# Patient Record
Sex: Male | Born: 1985
Health system: Southern US, Community
[De-identification: ages and names within clinical notes are randomized; demographics above are authoritative.]

## PROBLEM LIST (undated history)

## (undated) DIAGNOSIS — E669 Obesity, unspecified: Secondary | ICD-10-CM

## (undated) HISTORY — DX: Obesity, unspecified: E66.9

## (undated) HISTORY — PX: CIRCUMCISION: SUR203

---
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
## 2008-12-12 ENCOUNTER — Emergency Department: Payer: Self-pay | Admitting: Internal Medicine

## 2009-05-13 HISTORY — PX: HERNIA REPAIR: SHX51

## 2013-12-28 LAB — LIPID PANEL
Cholesterol: 156 mg/dL (ref 0–200)
HDL: 49 mg/dL (ref 35–70)
LDL Cholesterol: 99 mg/dL
TRIGLYCERIDES: 42 mg/dL (ref 40–160)

## 2014-01-18 ENCOUNTER — Ambulatory Visit: Payer: Self-pay | Admitting: General Practice

## 2015-01-31 IMAGING — CR DG CHEST 1V
1 series · 1 of 1 positions shown · non-contrast
Comparison: None.

CLINICAL DATA: Screening for tuberculosis

EXAM:
CHEST - 1 VIEW

[pa]
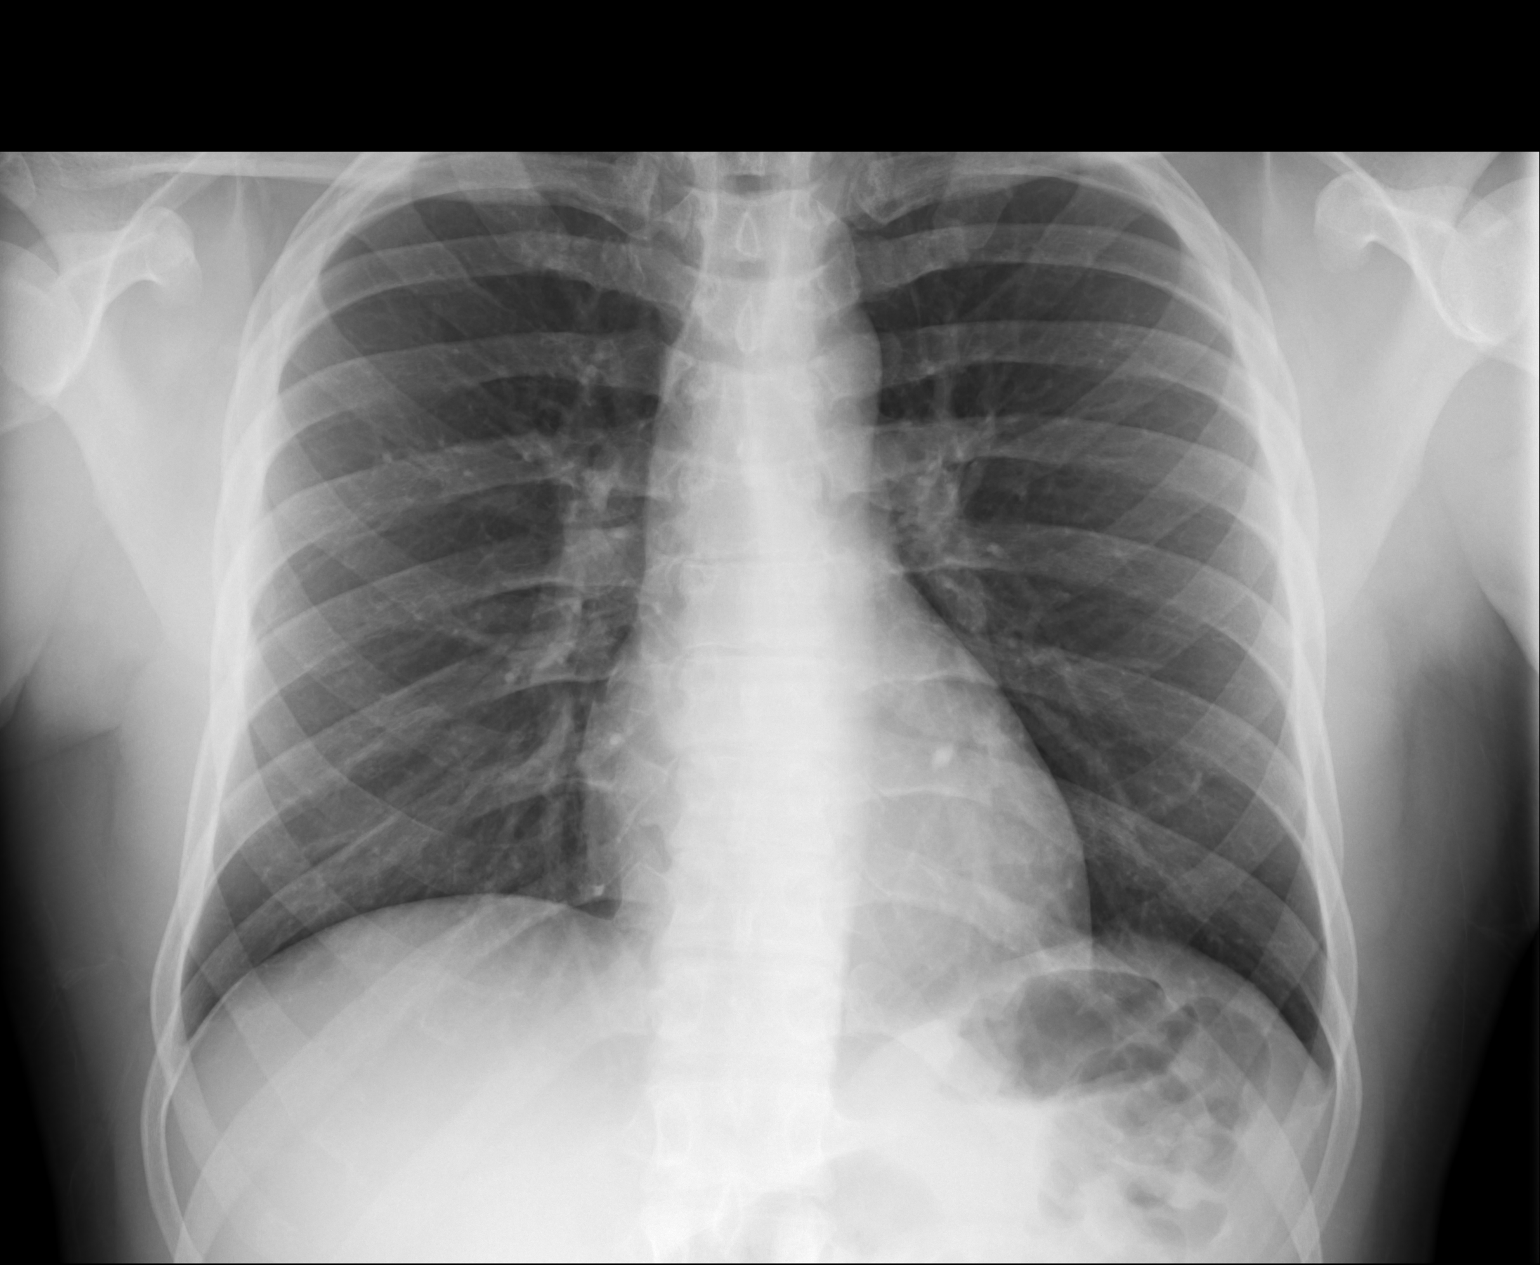

[1 of 1 positions shown; findings below may reference images not displayed]

FINDINGS: No active infiltrate or effusion is seen. No sequela of prior
tuberculous infection is seen. Mediastinal contours appear normal.
The heart is within normal limits in size. No bony abnormality is
noted.
IMPRESSION: No active lung disease.

## 2016-01-01 ENCOUNTER — Ambulatory Visit (INDEPENDENT_AMBULATORY_CARE_PROVIDER_SITE_OTHER): Payer: 59 | Admitting: Family Medicine

## 2016-01-01 ENCOUNTER — Encounter: Payer: Self-pay | Admitting: Family Medicine

## 2016-01-01 VITALS — BP 116/80 | HR 78 | Temp 98.4°F | Resp 16 | Ht 73.0 in | Wt 222.9 lb

## 2016-01-01 DIAGNOSIS — R5383 Other fatigue: Secondary | ICD-10-CM

## 2016-01-01 DIAGNOSIS — Z0000 Encounter for general adult medical examination without abnormal findings: Secondary | ICD-10-CM | POA: Diagnosis not present

## 2016-01-01 DIAGNOSIS — Z131 Encounter for screening for diabetes mellitus: Secondary | ICD-10-CM | POA: Diagnosis not present

## 2016-01-01 DIAGNOSIS — L910 Hypertrophic scar: Secondary | ICD-10-CM | POA: Diagnosis not present

## 2016-01-01 DIAGNOSIS — N528 Other male erectile dysfunction: Secondary | ICD-10-CM | POA: Diagnosis not present

## 2016-01-01 DIAGNOSIS — Z113 Encounter for screening for infections with a predominantly sexual mode of transmission: Secondary | ICD-10-CM | POA: Diagnosis not present

## 2016-01-01 DIAGNOSIS — E663 Overweight: Secondary | ICD-10-CM

## 2016-01-01 DIAGNOSIS — R0981 Nasal congestion: Secondary | ICD-10-CM | POA: Diagnosis not present

## 2016-01-01 DIAGNOSIS — Z13220 Encounter for screening for lipoid disorders: Secondary | ICD-10-CM | POA: Diagnosis not present

## 2016-01-01 DIAGNOSIS — B360 Pityriasis versicolor: Secondary | ICD-10-CM | POA: Insufficient documentation

## 2016-01-01 DIAGNOSIS — G4726 Circadian rhythm sleep disorder, shift work type: Secondary | ICD-10-CM | POA: Insufficient documentation

## 2016-01-01 DIAGNOSIS — E669 Obesity, unspecified: Secondary | ICD-10-CM | POA: Insufficient documentation

## 2016-01-01 DIAGNOSIS — M25519 Pain in unspecified shoulder: Secondary | ICD-10-CM | POA: Insufficient documentation

## 2016-01-01 MED ORDER — SILDENAFIL CITRATE 100 MG PO TABS
50.0000 mg | ORAL_TABLET | Freq: Every day | ORAL | Status: DC | PRN
Start: 2016-01-01 — End: 2017-10-04

## 2016-01-01 MED ORDER — MOMETASONE FUROATE 50 MCG/ACT NA SUSP: 2.0000 | Freq: Every day | NASAL | Status: DC

## 2016-01-01 NOTE — Progress Notes (Signed)
Name: Bryan Horton   MRN: [id number]    DOB: December 18, 1985   Date:01/01/2016       Progress Note  Subjective  Chief Complaint  Chief Complaint  Patient presents with  . Annual Exam  . Labs Only    HPI  Male Exam: he lost over 20 lbs over the past 2 years, by changing his diet and exercising, his bp is back to normal now. He feels tired, but thinks it is because he works too much.  ED: he has been in the same relationship for the past 9 years, they have two children together, he states he has libido, and more than half of the time that they have intercourse he has difficulty maintaining an erection for over 5 minutes.   AR: he has noticed nasal congestion and sinus pressure for the past few days, no fever, mild cough, no rashes.    Patient Active Problem List   Diagnosis Date Noted  . 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 Tinea versicolor 01/01/2016  . Pain in shoulder 01/01/2016  . Overweight (BMI 25.0-29.9) 01/01/2016    Past Surgical History  Procedure Laterality Date  . Circumcision      as an infant  . Hernia repair  05/13/2009    Umbilical    History reviewed. No pertinent family history.  Social History   Social History  . Marital Status: Single    Spouse Name: N/A  . Number of Children: N/A  . Years of Education: N/A   Occupational History  . Not on file.   Social History Main Topics  . Smoking status: Former Games developer  . Smokeless tobacco: Not on file  . Alcohol Use: 0.0 oz/week    0 Standard drinks or equivalent per week     Comment: seldom  . Drug Use: No  . Sexual Activity:    Partners: Female    Copy: None   Other Topics Concern  . Not on file   Social History Narrative     Current outpatient prescriptions:  .  mometasone (NASONEX) 50 MCG/ACT nasal spray, Place 2 sprays into the nose daily., Disp: 17 g, Rfl: 12 .  sildenafil (VIAGRA) 100 MG tablet, Take 0.5-1 tablets (50-100 mg total) by mouth daily as needed for  erectile dysfunction., Disp: 10 tablet, Rfl: 2  No Known Allergies   ROS  Constitutional: Negative for fever, positive for weight change.  Respiratory: Negative for cough and shortness of breath.   Cardiovascular: Negative for chest pain or palpitations.  Gastrointestinal: Negative for abdominal pain, no bowel changes.  Musculoskeletal: Negative for gait problem or joint swelling.  Skin: Negative for rash.  Neurological: Negative for dizziness or headache.  No other specific complaints in a complete review of systems (except as listed in HPI above).  Objective  Filed Vitals:   01/01/16 1031  Pulse: 78  Temp: 98.4 F (36.9 C)  TempSrc: Oral  Resp: 16  Height:  (1.854 m)  Weight: 222 lb 14.4 oz (101.107 kg)  SpO2: 97%    Body mass index is 29.41 kg/(m^2).  Physical Exam  Constitutional: Patient appears well-developed and well-nourished, overweight.  No distress.  HENT: Head: Normocephalic and atraumatic. Ears: B TMs ok, no erythema or effusion; Nose: Nose normal. Mouth/Throat: Oropharynx is clear and moist. No oropharyngeal exudate.  Eyes: Conjunctivae and EOM are normal. Pupils are equal, round, and reactive to light. No scleral icterus.  Neck: Normal range of motion. Neck supple. No JVD present. No  thyromegaly present.  Cardiovascular: Normal rate, regular rhythm and normal heart sounds.  No murmur heard. No BLE edema. Pulmonary/Chest: Effort normal and breath sounds normal. No respiratory distress. Abdominal: Soft. Bowel sounds are normal, no distension. There is no tenderness. no masses MALE GENITALIA: Normal descended testes bilaterally, no masses palpated, no hernias, no lesions, no discharge RECTAL: no done Musculoskeletal: Normal range of motion, no joint effusions. No gross deformities Neurological: he is alert and oriented to person, place, and time. No cranial nerve deficit. Coordination, balance, strength, speech and gait are normal.  Skin: Skin is warm,  dry skin and keloids. No erythema.  Psychiatric: Patient has a normal mood and affect. behavior is normal. Judgment and thought content normal.  PHQ2/9: Depression screen PHQ 2/9 01/01/2016  Decreased Interest 0  Down, Depressed, Hopeless 0  PHQ - 2 Score 0    Fall Risk: Fall Risk  01/01/2016  Falls in the past year? No    Functional Status Survey: Is the patient deaf or have difficulty hearing?: No Does the patient have difficulty seeing, even when wearing glasses/contacts?: No (patient wears glasses from time to time) Does the patient have difficulty concentrating, remembering, or making decisions?: No (patient stated that he has some issues with memory. he admitted to working a lot.) Does the patient have difficulty walking or climbing stairs?: No Does the patient have difficulty dressing or bathing?: No Does the patient have difficulty doing errands alone such as visiting a doctor's office or shopping?: No    Assessment & Plan  1. Annual physical exam  Discussed importance of 150 minutes of physical activity weekly, eat two servings of fish weekly, eat one serving of tree nuts ( cashews, pistachios, pecans, almonds.Marland Kitchen) every other day, eat 6 servings of fruit/vegetables daily and drink plenty of water and avoid sweet beverages.   2. Overweight (BMI 25.0-29.9)  Discussed with the patient the risk posed by an increased BMI. Discussed importance of portion control, calorie counting and at least 150 minutes of physical activity weekly. Avoid sweet beverages and drink more water. Eat at least 6 servings of fruit and vegetables daily   3. Routine screening for STI (sexually transmitted infection)  - Chlamydia/Gonococcus/Trichomonas, NAA - RPR - HIV antibody  4. Other fatigue  - TSH - Vitamin B12 - VITAMIN D 25 Hydroxy (Vit-D Deficiency, Fractures) - CBC with Differential/Platelet - Comprehensive metabolic panel  5. Diabetes mellitus screening  - Hemoglobin A1c  6. Lipid  screening  - Lipid panel   7. Other male erectile dysfunction  Likely from worrying about not having an erection, advised to try Viagra, discussed possible side effects, and wean self off after taking it a few times.  - sildenafil (VIAGRA) 100 MG tablet; Take 0.5-1 tablets (50-100 mg total) by mouth daily as needed for erectile dysfunction.  Dispense: 10 tablet; Refill: 2   8. Keloid scar  Stable, from old acne  9. Nasal congestion  We will try nasal steroid, call back if no improvement - mometasone (NASONEX) 50 MCG/ACT nasal spray; Place 2 sprays into the nose daily.  Dispense: 17 g; Refill: 12

## 2016-01-02 LAB — HEMOGLOBIN A1C
ESTIMATED AVERAGE GLUCOSE: 120 mg/dL
Hgb A1c MFr Bld: 5.8 % — ABNORMAL HIGH (ref 4.8–5.6)

## 2016-01-02 LAB — COMPREHENSIVE METABOLIC PANEL
ALBUMIN: 4.6 g/dL (ref 3.5–5.5)
ALK PHOS: 61 IU/L (ref 39–117)
ALT: 18 IU/L (ref 0–44)
AST: 20 IU/L (ref 0–40)
Albumin/Globulin Ratio: 1.6 (ref 1.1–2.5)
BUN / CREAT RATIO: 11 (ref 8–19)
BUN: 10 mg/dL (ref 6–20)
Bilirubin Total: 0.6 mg/dL (ref 0.0–1.2)
CALCIUM: 9.8 mg/dL (ref 8.7–10.2)
CO2: 28 mmol/L (ref 18–29)
CREATININE: 0.94 mg/dL (ref 0.76–1.27)
Chloride: 97 mmol/L (ref 96–106)
GFR calc Af Amer: 126 mL/min/{1.73_m2} (ref >59)
GFR, EST NON AFRICAN AMERICAN: 109 mL/min/{1.73_m2} (ref >59)
GLUCOSE: 88 mg/dL (ref 65–99)
Globulin, Total: 2.9 g/dL (ref 1.5–4.5)
Potassium: 4.4 mmol/L (ref 3.5–5.2)
Sodium: 139 mmol/L (ref 134–144)
Total Protein: 7.5 g/dL (ref 6.0–8.5)

## 2016-01-02 LAB — LIPID PANEL
CHOLESTEROL TOTAL: 155 mg/dL (ref 100–199)
Chol/HDL Ratio: 2.6 ratio units (ref 0.0–5.0)
HDL: 60 mg/dL (ref >39)
LDL CALC: 87 mg/dL (ref 0–99)
TRIGLYCERIDES: 39 mg/dL (ref 0–149)
VLDL CHOLESTEROL CAL: 8 mg/dL (ref 5–40)

## 2016-01-02 LAB — CBC WITH DIFFERENTIAL/PLATELET
BASOS ABS: 0 10*3/uL (ref 0.0–0.2)
Basos: 1 %
EOS (ABSOLUTE): 0.1 10*3/uL (ref 0.0–0.4)
EOS: 1 %
HEMATOCRIT: 48.9 % (ref 37.5–51.0)
HEMOGLOBIN: 15.9 g/dL (ref 12.6–17.7)
IMMATURE GRANULOCYTES: 0 %
Immature Grans (Abs): 0 10*3/uL (ref 0.0–0.1)
LYMPHS ABS: 1.9 10*3/uL (ref 0.7–3.1)
Lymphs: 29 %
MCH: 28.2 pg (ref 26.6–33.0)
MCHC: 32.5 g/dL (ref 31.5–35.7)
MCV: 87 fL (ref 79–97)
MONOCYTES: 6 %
Monocytes Absolute: 0.4 10*3/uL (ref 0.1–0.9)
NEUTROS PCT: 63 %
Neutrophils Absolute: 4.1 10*3/uL (ref 1.4–7.0)
Platelets: 266 10*3/uL (ref 150–379)
RBC: 5.64 x10E6/uL (ref 4.14–5.80)
RDW: 13.9 % (ref 12.3–15.4)
WBC: 6.5 10*3/uL (ref 3.4–10.8)

## 2016-01-02 LAB — HIV ANTIBODY (ROUTINE TESTING W REFLEX): HIV SCREEN 4TH GENERATION: NONREACTIVE

## 2016-01-02 LAB — VITAMIN B12: Vitamin B-12: 462 pg/mL (ref 211–946)

## 2016-01-02 LAB — RPR: RPR: NONREACTIVE

## 2016-01-02 LAB — VITAMIN D 25 HYDROXY (VIT D DEFICIENCY, FRACTURES): VIT D 25 HYDROXY: 14.4 ng/mL — AB (ref 30.0–100.0)

## 2016-01-02 LAB — TSH: TSH: 0.463 u[IU]/mL (ref 0.450–4.500)

## 2016-01-04 ENCOUNTER — Other Ambulatory Visit: Payer: Self-pay | Admitting: Family Medicine

## 2016-01-04 MED ORDER — VITAMIN D (ERGOCALCIFEROL) 1.25 MG (50000 UNIT) PO CAPS: 50000.0000 [IU] | ORAL_CAPSULE | ORAL | Status: DC

## 2016-07-22 ENCOUNTER — Other Ambulatory Visit: Payer: Self-pay | Admitting: Physical Medicine and Rehabilitation

## 2016-07-22 ENCOUNTER — Ambulatory Visit
Admission: RE | Admit: 2016-07-22 | Discharge: 2016-07-22 | Disposition: A | Discharge status: Home / Self Care | Payer: No Typology Code available for payment source | Source: Ambulatory Visit | Attending: Physical Medicine and Rehabilitation | Admitting: Physical Medicine and Rehabilitation

## 2016-07-22 DIAGNOSIS — Z0000 Encounter for general adult medical examination without abnormal findings: Secondary | ICD-10-CM

## 2017-02-08 ENCOUNTER — Encounter: Payer: Self-pay | Admitting: Family Medicine

## 2017-02-08 ENCOUNTER — Ambulatory Visit (INDEPENDENT_AMBULATORY_CARE_PROVIDER_SITE_OTHER): Payer: 59 | Admitting: Family Medicine

## 2017-02-08 VITALS — BP 126/88 | Ht 73.0 in | Wt 236.1 lb

## 2017-02-08 DIAGNOSIS — L739 Follicular disorder, unspecified: Secondary | ICD-10-CM

## 2017-02-08 DIAGNOSIS — B350 Tinea barbae and tinea capitis: Secondary | ICD-10-CM

## 2017-02-08 MED ORDER — GRISEOFULVIN MICROSIZE 500 MG PO TABS: 500.0000 mg | ORAL_TABLET | Freq: Every day | ORAL | 0 refills | Status: DC

## 2017-02-08 MED ORDER — DOXYCYCLINE HYCLATE 100 MG PO CAPS: 100.0000 mg | ORAL_CAPSULE | Freq: Two times a day (BID) | ORAL | 0 refills | Status: DC

## 2017-02-08 NOTE — Progress Notes (Signed)
   Subjective:    Patient ID: Bryan Horton, male    DOB: [birth year], 31 y.o.   MRN: [id number]  HPI Patient in today for abscess like spot in beard. Has c/o itching and sore . Patient is a new patient has a area on his beard some scaling crusting a little bit of pus drainage is on had tinea capitis. The patient relates some itching slight burning slight soreness minimal discharge no other particular troubles. States no other concerns this visit.    Review of Systems No fever chills no difficulty swallowing no cough vomiting    Objective:   Physical Exam The area has he appearance of a possible 10 he a versus localized infection no sign of abscess noted. No sign of any growth. Patient also has a keloid on the right side of his cheek/bearded area   Culture was taken from the central chin region where the scaling was    Assessment & Plan:  Doxycycline twice a day for next 10 days take with snack tall glass of water  Possible tinea recommend griseofulvin 500 mg 1 daily for the next 30 days take with a meal. Follow-up if ongoing troubles

## 2017-02-10 ENCOUNTER — Telehealth: Payer: Self-pay | Admitting: Family Medicine

## 2017-02-10 NOTE — Telephone Encounter (Signed)
Which medication is he speaking of doxycycline? Griseofulvin? It is possible that he may need to check a Price that a another pharmacy. Both of those medications were generic. I would recommend checking [facility] or even [facility] there can be quite a bit of variation of cost of medicine between pharmacy

## 2017-02-10 NOTE — Telephone Encounter (Signed)
Pt called stating that the medication that was called in was going to cost him over 100 dollars. Pt is wanting to know if there is a cheaper option. Please advise.    [facility]

## 2017-02-11 LAB — WOUND CULTURE: ORGANISM ID, BACTERIA: NONE SEEN

## 2017-02-11 NOTE — Telephone Encounter (Signed)
Patient stated the Griseofulvin is expensive and the pharmacist advised he see if he can get something cheaper

## 2017-02-13 NOTE — Telephone Encounter (Signed)
So another medication that can help is called generic Lamisil 250 mg 1 daily for 28 days. Before starting this a person has to do a liver function profile. If the liver function looks normal then this medicine can be used. This medication does have a potential side effects including elevated liver enzymes, in rare cases liver failure, rashes in some cases. Griseofulvin which was the other medicine does not require any lab testing. So therefore if he once to try Lamisil-he will need to get a liver function profile first if it is normal then we can prescribe this. I hope the cost is less then generic griseofulvin but only the pharmacist would know that -this information is not available to us

## 2017-02-14 NOTE — Telephone Encounter (Signed)
Spoke with patient and informed him per Dr.Scott Luking- So another medication that can help is called generic Lamisil 250 mg 1 daily for 28 days. Before starting this a person has to do a liver function profile. If the liver function looks normal then this medicine can be used. This medication does have a potential side effects including elevated liver enzymes, in rare cases liver failure, rashes in some cases. Griseofulvin which was the other medicine does not require any lab testing. So therefore if you want  to try Lamisil-you will need to get a liver function profile first if it is normal then we can prescribe this. Patient verbalized understanding and stated that he will hold off at this time.

## 2017-02-14 NOTE — Telephone Encounter (Signed)
Left message return call 02/14/17 

## 2017-03-11 ENCOUNTER — Telehealth: Payer: Self-pay | Admitting: *Deleted

## 2017-03-11 MED ORDER — DOXYCYCLINE HYCLATE 100 MG PO CAPS: 100.0000 mg | ORAL_CAPSULE | Freq: Two times a day (BID) | ORAL | 0 refills | Status: DC

## 2017-03-11 NOTE — Addendum Note (Signed)
Addended by: Margaretha Sheffield on: 03/11/2017 12:08 PM   Modules accepted: Orders

## 2017-03-11 NOTE — Telephone Encounter (Signed)
Renew the antibiotic/refill. If ongoing trouble follow-up with Dr. Brett Canales

## 2017-03-11 NOTE — Telephone Encounter (Signed)
Prescription sent electronically to pharmacy. Patient notified. 

## 2017-03-11 NOTE — Telephone Encounter (Signed)
Patient was seen for folliculilitis and tina barbae. Patient was placed on doxycycline and lamisil. Patient states he feels the rash is coming back. Patient has finished the antibiotic but still has a few days left of the lamisil-feels like he needs more antibiotic.  [facility]

## 2017-08-04 IMAGING — CR DG CHEST 1V
1 series · 1 of 1 positions shown · non-contrast
Comparison: 01/18/2014.

CLINICAL DATA: Routine checkup.  Obesity.

EXAM:
CHEST 1 VIEW

[w chest pa]
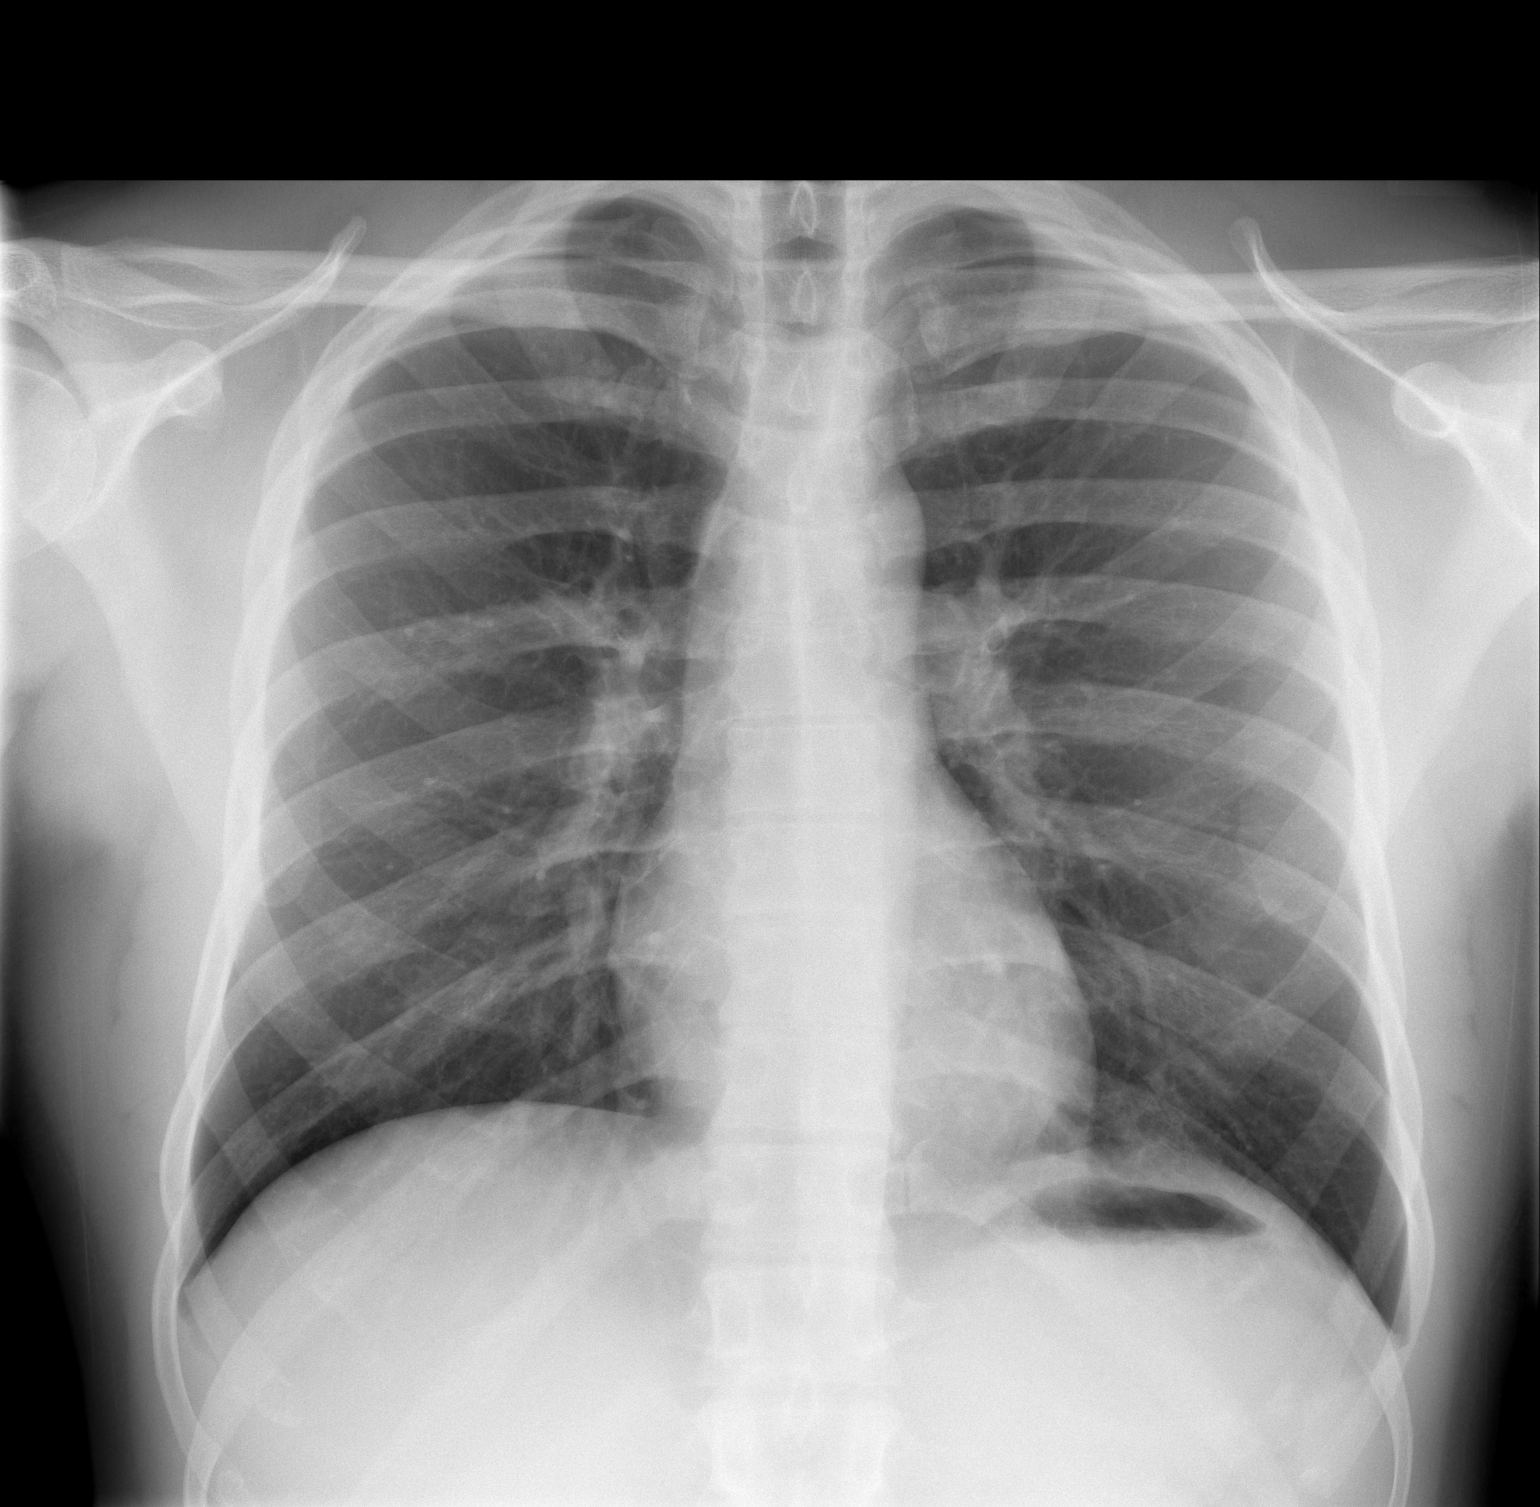

[1 of 1 positions shown; findings below may reference images not displayed]

FINDINGS: The heart size and mediastinal contours are within normal limits.
Both lungs are clear. The visualized skeletal structures are
unremarkable.
IMPRESSION: No active disease.

## 2017-09-05 DIAGNOSIS — W14XXXA Fall from tree, initial encounter: Secondary | ICD-10-CM | POA: Diagnosis not present

## 2017-09-05 DIAGNOSIS — S8992XA Unspecified injury of left lower leg, initial encounter: Secondary | ICD-10-CM | POA: Diagnosis not present

## 2017-09-05 DIAGNOSIS — M79662 Pain in left lower leg: Secondary | ICD-10-CM | POA: Diagnosis not present

## 2017-10-04 ENCOUNTER — Encounter: Payer: Self-pay | Admitting: Family Medicine

## 2017-10-04 ENCOUNTER — Ambulatory Visit (INDEPENDENT_AMBULATORY_CARE_PROVIDER_SITE_OTHER): Payer: 59 | Admitting: Family Medicine

## 2017-10-04 VITALS — BP 130/90 | HR 64 | Temp 97.7°F | Resp 14 | Ht 72.44 in | Wt 229.6 lb

## 2017-10-04 DIAGNOSIS — F520 Hypoactive sexual desire disorder: Secondary | ICD-10-CM

## 2017-10-04 DIAGNOSIS — E669 Obesity, unspecified: Secondary | ICD-10-CM

## 2017-10-04 DIAGNOSIS — Z23 Encounter for immunization: Secondary | ICD-10-CM

## 2017-10-04 DIAGNOSIS — R5383 Other fatigue: Secondary | ICD-10-CM

## 2017-10-04 DIAGNOSIS — R739 Hyperglycemia, unspecified: Secondary | ICD-10-CM

## 2017-10-04 DIAGNOSIS — Z0000 Encounter for general adult medical examination without abnormal findings: Secondary | ICD-10-CM

## 2017-10-04 DIAGNOSIS — Z113 Encounter for screening for infections with a predominantly sexual mode of transmission: Secondary | ICD-10-CM

## 2017-10-04 DIAGNOSIS — Z13220 Encounter for screening for lipoid disorders: Secondary | ICD-10-CM

## 2017-10-04 DIAGNOSIS — R030 Elevated blood-pressure reading, without diagnosis of hypertension: Secondary | ICD-10-CM

## 2017-10-04 DIAGNOSIS — E559 Vitamin D deficiency, unspecified: Secondary | ICD-10-CM

## 2017-10-04 DIAGNOSIS — Z131 Encounter for screening for diabetes mellitus: Secondary | ICD-10-CM

## 2017-10-04 NOTE — Patient Instructions (Signed)
Preventive Care 18-39 Years, Male Preventive care refers to lifestyle choices and visits with your health care provider that can promote health and wellness. What does preventive care include?  A yearly physical exam. This is also called an annual well check.  Dental exams once or twice a year.  Routine eye exams. Ask your health care provider how often you should have your eyes checked.  Personal lifestyle choices, including: ? Daily care of your teeth and gums. ? Regular physical activity. ? Eating a healthy diet. ? Avoiding tobacco and drug use. ? Limiting alcohol use. ? Practicing safe sex. What happens during an annual well check? The services and screenings done by your health care provider during your annual well check will depend on your age, overall health, lifestyle risk factors, and family history of disease. Counseling Your health care provider may ask you questions about your:  Alcohol use.  Tobacco use.  Drug use.  Emotional well-being.  Home and relationship well-being.  Sexual activity.  Eating habits.  Work and work Statistician.  Screening You may have the following tests or measurements:  Height, weight, and BMI.  Blood pressure.  Lipid and cholesterol levels. These may be checked every 5 years starting at age 31.  Diabetes screening. This is done by checking your blood sugar (glucose) after you have not eaten for a while (fasting).  Skin check.  Hepatitis C blood test.  Hepatitis B blood test.  Sexually transmitted disease (STD) testing.  Discuss your test results, treatment options, and if necessary, the need for more tests with your health care provider. Vaccines Your health care provider may recommend certain vaccines, such as:  Influenza vaccine. This is recommended every year.  Tetanus, diphtheria, and acellular pertussis (Tdap, Td) vaccine. You may need a Td booster every 10 years.  Varicella vaccine. You may need this if you  have not been vaccinated.  HPV vaccine. If you are 79 or younger, you may need three doses over 6 months.  Measles, mumps, and rubella (MMR) vaccine. You may need at least one dose of MMR.You may also need a second dose.  Pneumococcal 13-valent conjugate (PCV13) vaccine. You may need this if you have certain conditions and have not been vaccinated.  Pneumococcal polysaccharide (PPSV23) vaccine. You may need one or two doses if you smoke cigarettes or if you have certain conditions.  Meningococcal vaccine. One dose is recommended if you are age 46-21 years and a first-year college student living in a residence hall, or if you have one of several medical conditions. You may also need additional booster doses.  Hepatitis A vaccine. You may need this if you have certain conditions or if you travel or work in places where you may be exposed to hepatitis A.  Hepatitis B vaccine. You may need this if you have certain conditions or if you travel or work in places where you may be exposed to hepatitis B.  Haemophilus influenzae type b (Hib) vaccine. You may need this if you have certain risk factors.  Talk to your health care provider about which screenings and vaccines you need and how often you need them. This information is not intended to replace advice given to you by your health care provider. Make sure you discuss any questions you have with your health care provider. Document Released: 01/11/2002 Document Revised: 08/04/2016 Document Reviewed: 09/16/2015 Elsevier Interactive Patient Education  2017 [employer].

## 2017-10-04 NOTE — Progress Notes (Signed)
Name: Bryan Horton   MRN: [id number]    DOB: 1986-04-09   Date:10/04/2017       Progress Note  Subjective  Chief Complaint  Chief Complaint  Patient presents with  . Annual Exam    HPI  Well Male Exam: he is living with girlfriend for the past 10 years, they have a 31 yo son . He has ED and viagra did not help. He states he is always tired and has no libido even when they travel. Unable to maintain an erection. He has a hard job , works too many hours per day.   Hyperglycemia: denies polyphagia, polydipsia or polyuria. He is obese and discussed importance of losing weight.   Other fatigue: he feels tired, works different shifts on a regular basis. He likes to fish, hunt but not lately.   Ankle injury: he fell out of a deer stand about 6 weeks ago , he went to urgent care, he still has occasional pain, still has swelling, taking naproxen , diagnosed with a strain of left ankle. X-ray not available to review.    Patient Active Problem List   Diagnosis Date Noted  . 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 Tinea versicolor 01/01/2016  . Pain in shoulder 01/01/2016  . Obesity (BMI 30.0-34.9) 01/01/2016    Past Surgical History:  Procedure Laterality Date  . CIRCUMCISION     as an infant  . HERNIA REPAIR  05/13/2009   Umbilical    History reviewed. No pertinent family history.  Social History   Socioeconomic History  . Marital status: Significant Other    Spouse name: Not on file  . Number of children: 1  . Years of education: Not on file  . Highest education level: High school graduate  Social Needs  . Financial resource strain: Not hard at all  . Food insecurity - worry: Never true  . Food insecurity - inability: Never true  . Transportation needs - medical: No  . Transportation needs - non-medical: No  Occupational History  . Occupation: Medical sales representativemaking technician     Employer: [employer]  Tobacco Use  . Smoking status: Former Games developermoker  . Smokeless tobacco: Never  Used  Substance and Sexual Activity  . Alcohol use: Yes    Alcohol/week: 0.0 oz    Comment: seldom  . Drug use: No  . Sexual activity: Yes    Partners: Female    Birth control/protection: None  Other Topics Concern  . Not on file  Social History Narrative   Lives with significant for the past 10 years, they have a 31 yo son   He works 12 hours shifts, it can be day or night.     Current Outpatient Medications:  .  naproxen (NAPROSYN) 500 MG tablet, Take 1 tablet 2 (two) times daily as needed by mouth., Disp: , Rfl:  .  mometasone (NASONEX) 50 MCG/ACT nasal spray, Place 2 sprays into the nose daily. (Patient not taking: Reported on 02/08/2017), Disp: 17 g, Rfl: 12  No Known Allergies   ROS  Constitutional: Negative for fever or significant  weight change.  Respiratory: Negative for cough and shortness of breath.   Cardiovascular: Negative for chest pain or palpitations.  Gastrointestinal: Negative for abdominal pain, no bowel changes.  Musculoskeletal: Positive for mild  gait problem and left  joint swelling.  Skin: Negative for rash.  Neurological: Negative for dizziness or headache.  No other specific complaints in a complete review of systems (except as listed  in HPI above).  Objective  Vitals:   10/04/17 1349  BP: 130/90  Pulse: 64  Resp: 14  Temp: 97.7 F (36.5 C)  TempSrc: Oral  SpO2: 99%  Weight: 229 lb 9.6 oz (104.1 kg)  Height: 6' 0.44" (1.84 m)    Body mass index is 30.76 kg/m.  Physical Exam  Constitutional: Patient appears well-developed and well-nourished. No distress.  HENT: Head: Normocephalic and atraumatic. Ears: B TMs ok, no erythema or effusion; Nose: Nose normal. Mouth/Throat: Oropharynx is clear and moist. No oropharyngeal exudate.  Eyes: Conjunctivae and EOM are normal. Pupils are equal, round, and reactive to light. No scleral icterus.  Neck: Normal range of motion. Neck supple. No JVD present. No thyromegaly present.  Cardiovascular:  Normal rate, regular rhythm and normal heart sounds.  No murmur heard. No BLE edema. Pulmonary/Chest: Effort normal and breath sounds normal. No respiratory distress. Abdominal: Soft. Bowel sounds are normal, no distension. There is no tenderness. no masses MALE GENITALIA: Normal descended testes bilaterally, no masses palpated, no hernias, no lesions, no discharge RECTAL: Prostate normal size and consistency, no rectal masses or hemorrhoids Musculoskeletal: Normal range of motion, no joint effusions. No gross deformities. He has pain during palpation of left lateral malleolus Neurological: he is alert and oriented to person, place, and time. No cranial nerve deficit. Coordination, balance, strength, speech and gait are normal.  Skin: Skin is warm and dry. No rash noted. No erythema.  Psychiatric: Patient has a normal mood and affect. behavior is normal. Judgment and thought content normal.  PHQ2/9: Depression screen [facility] 2/9 10/04/2017 01/01/2016  Decreased Interest 0 0  Down, Depressed, Hopeless 0 0  PHQ - 2 Score 0 0     Fall Risk: Fall Risk  10/04/2017 01/01/2016  Falls in the past year? Yes No  Number falls in past yr: 1 -  Injury with Fall? Yes -  Follow up Education provided -     Assessment & Plan  1. Annual physical exam  Discussed importance of 150 minutes of physical activity weekly, eat two servings of fish weekly, eat one serving of tree nuts ( cashews, pistachios, pecans, almonds.Marland Kitchen.) every other day, eat 6 servings of fruit/vegetables daily and drink plenty of water and avoid sweet beverages.   He seems depressed and having problems with girlfriend. Discussed counseling. PHQ9 negative, but seems to be down, denies suicidal thoughts or ideation    - VITAMIN D 25 Hydroxy (Vit-D Deficiency, Fractures) - TSH - Vitamin B12 - CBC with Differential/Platelet - COMPLETE METABOLIC PANEL WITH GFR - Lipid panel - Hemoglobin A1c - HIV antibody - RPR - GC/Chlamydia Probe  Amp  2. Diabetes mellitus screening  -hgbA1C  3. Lipid screening  - Lipid panel  4. Obesity (BMI 30.0-34.9)  Discussed with the patient the risk posed by an increased BMI. Discussed importance of portion control, calorie counting and at least 150 minutes of physical activity weekly. Avoid sweet beverages and drink more water. Eat at least 6 servings of fruit and vegetables daily   5. Needs flu shot  - Tdap vaccine greater than or equal to 7yo IM  6. Need for Tdap vaccination  refused  7. Hyperglycemia  - Hemoglobin A1c  8. Vitamin D deficiency  - VITAMIN D 25 Hydroxy (Vit-D Deficiency, Fractures)  9. Other fatigue  - TSH - Vitamin B12 - CBC with Differential/Platelet - COMPLETE METABOLIC PANEL WITH GFR  10. Routine screening for STI (sexually transmitted infection)  - HIV antibody - RPR -  GC/Chlamydia Probe Amp  11. Lack of libido  - Testosterone Total,Free,Bio, Males  12. Elevated blood pressure reading  Patient only slept 2 hours prior to visit, bp is usually at goal, we will monitor for now, he will check bp at home and if remains above 140/90 he will return sooner for follow up

## 2017-10-05 LAB — C. TRACHOMATIS/N. GONORRHOEAE RNA
C. TRACHOMATIS RNA, TMA: NOT DETECTED
N. GONORRHOEAE RNA, TMA: NOT DETECTED

## 2017-10-05 LAB — CBC WITH DIFFERENTIAL/PLATELET
BASOS ABS: 83 {cells}/uL (ref 0–200)
Basophils Relative: 1.1 %
EOS PCT: 4 %
Eosinophils Absolute: 300 cells/uL (ref 15–500)
HEMATOCRIT: 44.4 % (ref 38.5–50.0)
Hemoglobin: 14.8 g/dL (ref 13.2–17.1)
Lymphs Abs: 2100 cells/uL (ref 850–3900)
MCH: 28.4 pg (ref 27.0–33.0)
MCHC: 33.3 g/dL (ref 32.0–36.0)
MCV: 85.1 fL (ref 80.0–100.0)
MPV: 10.3 fL (ref 7.5–12.5)
Monocytes Relative: 7.8 %
NEUTROS PCT: 59.1 %
Neutro Abs: 4433 cells/uL (ref 1500–7800)
Platelets: 268 10*3/uL (ref 140–400)
RBC: 5.22 10*6/uL (ref 4.20–5.80)
RDW: 12.4 % (ref 11.0–15.0)
TOTAL LYMPHOCYTE: 28 %
WBC mixed population: 585 cells/uL (ref 200–950)
WBC: 7.5 10*3/uL (ref 3.8–10.8)

## 2017-10-05 LAB — HEMOGLOBIN A1C
Hgb A1c MFr Bld: 5.3 % of total Hgb (ref <5.7)
Mean Plasma Glucose: 105 (calc)
eAG (mmol/L): 5.8 (calc)

## 2017-10-05 LAB — TESTOSTERONE TOTAL,FREE,BIO, MALES
ALBUMIN MSPROF: 4.6 g/dL (ref 3.6–5.1)
Sex Hormone Binding: 27 nmol/L (ref 10–50)
Testosterone, Bioavailable: 158.7 ng/dL (ref >=110.0)
Testosterone, Free: 75.6 pg/mL (ref 46.0–224.0)
Testosterone: 482 ng/dL (ref 250–827)

## 2017-10-05 LAB — RPR: RPR Ser Ql: NONREACTIVE

## 2017-10-05 LAB — COMPLETE METABOLIC PANEL WITH GFR
AG Ratio: 1.6 (calc) (ref 1.0–2.5)
ALKALINE PHOSPHATASE (APISO): 50 U/L (ref 40–115)
ALT: 14 U/L (ref 9–46)
AST: 17 U/L (ref 10–40)
Albumin: 4.7 g/dL (ref 3.6–5.1)
BILIRUBIN TOTAL: 0.5 mg/dL (ref 0.2–1.2)
BUN: 11 mg/dL (ref 7–25)
CHLORIDE: 101 mmol/L (ref 98–110)
CO2: 30 mmol/L (ref 20–32)
Calcium: 9.6 mg/dL (ref 8.6–10.3)
Creat: 0.97 mg/dL (ref 0.60–1.35)
GFR, Est African American: 120 mL/min/{1.73_m2} (ref >=60)
GFR, Est Non African American: 104 mL/min/{1.73_m2} (ref >=60)
GLUCOSE: 90 mg/dL (ref 65–99)
Globulin: 3 g/dL (calc) (ref 1.9–3.7)
Potassium: 4.4 mmol/L (ref 3.5–5.3)
Sodium: 140 mmol/L (ref 135–146)
Total Protein: 7.7 g/dL (ref 6.1–8.1)

## 2017-10-05 LAB — LIPID PANEL
CHOLESTEROL: 159 mg/dL (ref <200)
HDL: 58 mg/dL (ref >40)
LDL CHOLESTEROL (CALC): 87 mg/dL
Non-HDL Cholesterol (Calc): 101 mg/dL (calc) (ref <130)
TRIGLYCERIDES: 46 mg/dL (ref <150)
Total CHOL/HDL Ratio: 2.7 (calc) (ref <5.0)

## 2017-10-05 LAB — HIV ANTIBODY (ROUTINE TESTING W REFLEX): HIV: NONREACTIVE

## 2017-10-05 LAB — VITAMIN D 25 HYDROXY (VIT D DEFICIENCY, FRACTURES): VIT D 25 HYDROXY: 39 ng/mL (ref 30–100)

## 2017-10-05 LAB — TSH: TSH: 1.01 m[IU]/L (ref 0.40–4.50)

## 2017-10-05 LAB — VITAMIN B12: VITAMIN B 12: 662 pg/mL (ref 200–1100)

## 2017-10-10 ENCOUNTER — Telehealth: Payer: Self-pay | Admitting: Family Medicine

## 2017-10-10 NOTE — Telephone Encounter (Signed)
Copied from CRM [id number]. Topic: Quick Communication - See Telephone Encounter >> Oct 10, 2017 12:03 PM Lilia Proeid, Karysha J wrote: CRM for notification. See Telephone encounter for: pt called to check status labs asking for call back to review results  10/10/17.

## 2017-10-10 NOTE — Telephone Encounter (Signed)
Reviewed labs with patient as specified by physician. He understood results with no additional questions

## 2017-12-07 DIAGNOSIS — J029 Acute pharyngitis, unspecified: Secondary | ICD-10-CM | POA: Diagnosis not present

## 2017-12-07 DIAGNOSIS — J018 Other acute sinusitis: Secondary | ICD-10-CM | POA: Diagnosis not present

## 2018-02-03 ENCOUNTER — Ambulatory Visit: Payer: 59 | Admitting: Family Medicine

## 2018-02-03 ENCOUNTER — Encounter: Payer: Self-pay | Admitting: Family Medicine

## 2018-02-03 VITALS — BP 120/80 | HR 81 | Temp 98.4°F | Resp 18 | Ht 72.0 in | Wt 232.1 lb

## 2018-02-03 DIAGNOSIS — R1032 Left lower quadrant pain: Secondary | ICD-10-CM

## 2018-02-03 DIAGNOSIS — N5082 Scrotal pain: Secondary | ICD-10-CM | POA: Diagnosis not present

## 2018-02-03 LAB — POCT URINALYSIS DIPSTICK
BILIRUBIN UA: NEGATIVE
Glucose, UA: NEGATIVE
KETONES UA: NEGATIVE
Leukocytes, UA: NEGATIVE
NITRITE UA: NEGATIVE
PH UA: 5 (ref 5.0–8.0)
PROTEIN UA: NEGATIVE
RBC UA: NEGATIVE
Spec Grav, UA: 1.02 (ref 1.010–1.025)
UROBILINOGEN UA: NEGATIVE U/dL — AB

## 2018-02-03 MED ORDER — NAPROXEN 500 MG PO TABS: 500.0000 mg | ORAL_TABLET | Freq: Two times a day (BID) | ORAL | 0 refills | Status: DC

## 2018-02-03 NOTE — Progress Notes (Signed)
Name: Bryan Horton   MRN: [id number]    DOB: 1986-10-28   Date:02/03/2018       Progress Note  Subjective  Chief Complaint  Chief Complaint  Patient presents with  . Abdominal Pain    left side for 1 week  . Urinary Frequency    HPI  Pt presents with new onset LEFT lower quadrant pain that radiates into the left testicle that has been intermittent for "a while" but worse over the last week.  Denies testicular swelling or tenderness; no penile discharge or bleeding. Is sexually active - 1 partner in the last year.  He denies any injury, history of kidney stones, constipation, diarrhea, nausea, vomiting, flank/back pain, no blood in stool, no dark and tarry stools, no hematuria, no fevers/chills.  UA is clear today.  Does report some decreased force of stream.  Patient Active Problem List   Diagnosis Date Noted  . 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 Tinea versicolor 01/01/2016  . Pain in shoulder 01/01/2016  . Obesity (BMI 30.0-34.9) 01/01/2016    Social History   Tobacco Use  . Smoking status: Former Games developermoker  . Smokeless tobacco: Never Used  Substance Use Topics  . Alcohol use: Yes    Alcohol/week: 0.0 oz    Comment: seldom     Current Outpatient Medications:  .  mometasone (NASONEX) 50 MCG/ACT nasal spray, Place 2 sprays into the nose daily. (Patient not taking: Reported on 02/08/2017), Disp: 17 g, Rfl: 12  No Known Allergies  ROS  Constitutional: Negative for fever or weight change.  Respiratory: Negative for cough and shortness of breath.   Cardiovascular: Negative for chest pain or palpitations.  Gastrointestinal/GU: See HPI  Musculoskeletal: Negative for gait problem or joint swelling.  Skin: Negative for rash.  Neurological: Negative for dizziness or headache.  No other specific complaints in a complete review of systems (except as listed in HPI above).  Objective  Vitals:   02/03/18 1330  BP: 120/80  Pulse: 81  Resp: 18  Temp: 98.4 F (36.9 C)   TempSrc: Oral  SpO2: 96%  Weight: 232 lb 1.6 oz (105.3 kg)  Height: 6' (1.829 m)   Body mass index is 31.48 kg/m.  Nursing Note and Vital Signs reviewed.  Physical Exam  Constitutional: Patient appears well-developed and well-nourished. Obese No distress.  HEENT: head atraumatic, normocephalic, oropharynx pink and moist without exudate Cardiovascular: Normal rate, regular rhythm, S1/S2 present.  No murmur or rub heard. No BLE edema. Pulmonary/Chest: Effort normal and breath sounds clear. No respiratory distress or retractions. Abdominal: Soft and non-tender without guarding, distension, or HSM, bowel sounds present x4 quadrants.  No CVA Tenderness MALE GENITALIA: Normal descended testes bilaterally, no masses palpated, no hernias palpated, no lesions, no discharge. LEFT testicle is positive for tenderness on palpation, RIGHT testing is negative for tenderness. RECTAL: Deferred Psychiatric: Patient has a normal mood and affect. behavior is normal. Judgment and thought content normal.  No results found for this or any previous visit (from the past 72 hour(s)).  Assessment & Plan  1. Scrotal pain - Urine Culture - C. trachomatis/N. gonorrhoeae RNA - US Scrotum; Future - naproxen (NAPROSYN) 500 MG tablet; Take 1 tablet (500 mg total) by mouth 2 (two) times daily with a meal.  Dispense: 20 tablet; Refill: 0  2. Left lower quadrant pain - Urine Culture - C. trachomatis/N. gonorrhoeae RNA - POCT urinalysis dipstick - No tenderness on examination, question if referred pain from left testicle as this was  positive for tenderness.  - Advised that if work-up is negative and pain continues, we will consider referral to urology for further evaluation - pt verbalizes understanding.  -Red flags and when to present for emergency care or RTC including fever >101.53F, severe pain, nausea, vomiting, scrotal swelling, blood in your urine, chills new/worsening/un-resolving symptoms, reviewed with  patient at time of visit. Follow up and care instructions discussed and provided in AVS.

## 2018-02-03 NOTE — Patient Instructions (Signed)
If you develop severe pain, nausea, vomiting, scrotal swelling, blood in your urine, fevers or chills, or any other concerning symptoms please go to the ER or Urgent Care for for further evaluation.

## 2018-02-04 LAB — URINE CULTURE
MICRO NUMBER: 90300857
Result:: NO GROWTH
SPECIMEN QUALITY:: ADEQUATE

## 2018-02-04 LAB — C. TRACHOMATIS/N. GONORRHOEAE RNA
C. trachomatis RNA, TMA: NOT DETECTED
N. gonorrhoeae RNA, TMA: NOT DETECTED

## 2018-04-03 ENCOUNTER — Ambulatory Visit: Payer: 59 | Admitting: Family Medicine

## 2018-04-03 ENCOUNTER — Encounter: Payer: Self-pay | Admitting: Family Medicine

## 2018-04-03 VITALS — BP 112/86 | HR 61 | Resp 16 | Ht 72.0 in | Wt 228.4 lb

## 2018-04-03 DIAGNOSIS — E669 Obesity, unspecified: Secondary | ICD-10-CM

## 2018-04-03 DIAGNOSIS — E559 Vitamin D deficiency, unspecified: Secondary | ICD-10-CM

## 2018-04-03 DIAGNOSIS — F320 Major depressive disorder, single episode, mild: Secondary | ICD-10-CM | POA: Diagnosis not present

## 2018-04-03 DIAGNOSIS — R739 Hyperglycemia, unspecified: Secondary | ICD-10-CM | POA: Diagnosis not present

## 2018-04-03 DIAGNOSIS — H938X3 Other specified disorders of ear, bilateral: Secondary | ICD-10-CM | POA: Diagnosis not present

## 2018-04-03 DIAGNOSIS — G4700 Insomnia, unspecified: Secondary | ICD-10-CM

## 2018-04-03 MED ORDER — TRAZODONE HCL 50 MG PO TABS: 25.0000 mg | ORAL_TABLET | Freq: Every evening | ORAL | 1 refills | Status: DC | PRN

## 2018-04-03 MED ORDER — THERA VITAL M PO TABS: 1.0000 | ORAL_TABLET | Freq: Every day | ORAL | 0 refills | Status: DC

## 2018-04-03 MED ORDER — ESCITALOPRAM OXALATE 10 MG PO TABS: 10.0000 mg | ORAL_TABLET | Freq: Every day | ORAL | 1 refills | Status: DC

## 2018-04-03 NOTE — Progress Notes (Signed)
Name: Bryan Horton   MRN: [id number]    DOB: [birth year]   Date:04/03/2018       Progress Note  Subjective  Chief Complaint  Chief Complaint  Patient presents with  . Follow-up    HPI  Obesity: he has changed his diet, no sodas in the past 2 months, only drinking water. He is also going to the gym three times a week. He is eating baked food and lean meat. He eats a lot of fruit and vegetables. He states he still eats when not hungry, snacks after meals. Weight is stable. Reviewed labs done 6 months ago with patient and hgbA1C back to normal and lipid panel also at goal  Scrotal pain: seen by Maurice Small NP back in March 2019. [place] was not done, but he states pain resolved with Naproxen after 2 weeks. No symptoms since.   Hyperglycemia: last hgbA1C back to normal, he states he likes to eat, no polyuria or polydipsia.   Vitamin D def: last level was normal, he just started taking multivitamin otc  Insomnia: he has difficulty falling and staying asleep. He states he needs to get up and walk around. He states they are in the process of buying a house and is more stressed because of that.   Major Depression: going on for the past couple of years, he lost a couple of aunts and his grandmother, he states they helped raise him. He has lack of energy and motivation, sometimes packs his fishing gear but decides not to go fishing or hunting. Avoids being around loved ones, sometimes loses his patient. He has anhedonia. He never properly grieve the loss of his relatives. Difficulty falling and staying asleep. Discussed counseling and medication and he agrees.     Patient Active Problem List   Diagnosis Date Noted  . 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 Tinea versicolor 01/01/2016  . Pain in shoulder 01/01/2016  . Obesity (BMI 30.0-34.9) 01/01/2016    Past Surgical History:  Procedure Laterality Date  . CIRCUMCISION     as an infant  . HERNIA REPAIR  05/13/2009   Umbilical    Family  History  Problem Relation Age of Onset  . Hypertension Mother   . Hypertension Father   . Diabetes Father     Social History   Socioeconomic History  . Marital status: Significant Other    Spouse name: Carollee Herter   . Number of children: 1  . Years of education: Not on file  . Highest education level: High school graduate  Occupational History  . Occupation: Medical sales representative: [employer]  Social Needs  . Financial resource strain: Not hard at all  . Food insecurity:    Worry: Never true    Inability: Never true  . Transportation needs:    Medical: No    Non-medical: No  Tobacco Use  . Smoking status: Former Smoker    Years: 2.00    Types: Cigars  . Smokeless tobacco: Never Used  Substance and Sexual Activity  . Alcohol use: Yes    Alcohol/week: 0.0 oz    Comment: seldom  . Drug use: No  . Sexual activity: Yes    Partners: Female    Birth control/protection: None  Lifestyle  . Physical activity:    Days per week: 3 days    Minutes per session: 100 min  . Stress: Only a little  Relationships  . Social connections:    Talks on phone:  More than three times a week    Gets together: Never    Attends religious service: More than 4 times per year    Active member of club or organization: No    Attends meetings of clubs or organizations: Never    Relationship status: Living with partner  . Intimate partner violence:    Fear of current or ex partner: No    Emotionally abused: No    Physically abused: No    Forced sexual activity: No  Other Topics Concern  . Not on file  Social History Narrative   Lives with significant for the past 10 years, they have a 38 yo son   He works 12 hours shifts, it can be day or night.     Current Outpatient Medications:  .  escitalopram (LEXAPRO) 10 MG tablet, Take 1 tablet (10 mg total) by mouth daily., Disp: 30 tablet, Rfl: 1 .  Multiple Vitamins-Minerals (MULTIVITAMIN) tablet, Take 1 tablet by mouth daily., Disp:  30 tablet, Rfl: 0 .  traZODone (DESYREL) 50 MG tablet, Take 0.5-1 tablets (25-50 mg total) by mouth at bedtime as needed for sleep., Disp: 30 tablet, Rfl: 1  No Known Allergies   ROS  Constitutional: Negative for fever or weight change.  Respiratory: Negative for cough and shortness of breath.   Cardiovascular: Negative for chest pain or palpitations.  Gastrointestinal: Negative for abdominal pain, no bowel changes.  Musculoskeletal: Negative for gait problem or joint swelling.  Skin: Negative for rash.  Neurological: Negative for dizziness or headache.  No other specific complaints in a complete review of systems (except as listed in HPI above).  Objective  Vitals:   04/03/18 0803  BP: 112/86  Pulse: 61  Resp: 16  SpO2: 98%  Weight: 228 lb 6.4 oz (103.6 kg)  Height: 6' (1.829 m)    Body mass index is 30.98 kg/m.  Physical Exam  Constitutional: Patient appears well-developed and well-nourished. Obese  No distress.  HEENT: head atraumatic, normocephalic, pupils equal and reactive to light,  neck supple, throat within normal limits Cardiovascular: Normal rate, regular rhythm and normal heart sounds.  No murmur heard. No BLE edema. Pulmonary/Chest: Effort normal and breath sounds normal. No respiratory distress. Abdominal: Soft.  There is no tenderness. Psychiatric: Patient has a depressed mood and affect. behavior is normal. Judgment and thought content normal.   Recent Results (from the past 2160 hour(s))  POCT urinalysis dipstick     Status: Abnormal   Collection Time: 02/03/18  1:54 PM  Result Value Ref Range   Color, UA yellow    Clarity, UA clear    Glucose, UA negative    Bilirubin, UA negative    Ketones, UA negative    Spec Grav, UA 1.020 1.010 - 1.025   Blood, UA negative    pH, UA 5.0 5.0 - 8.0   Protein, UA negative    Urobilinogen, UA negative (A) 0.2 or 1.0 E.U./dL   Nitrite, UA negative    Leukocytes, UA Negative Negative   Appearance clear     Odor none   C. trachomatis/N. gonorrhoeae RNA     Status: None   Collection Time: 02/03/18  2:05 PM  Result Value Ref Range   C. trachomatis RNA, TMA NOT DETECTED NOT DETECT   N. gonorrhoeae RNA, TMA NOT DETECTED NOT DETECT    Comment: This test was performed using the APTIMA COMBO2 Assay ([employer] . The analytical performance characteristics of this  assay, when used to test  SurePath specimens have been determined by [employer]. .   Urine Culture     Status: None   Collection Time: 02/03/18  2:08 PM  Result Value Ref Range   MICRO NUMBER: [id number]    SPECIMEN QUALITY: ADEQUATE    Sample Source URINE    STATUS: FINAL    Result: No Growth       PHQ2/9: Depression screen [facility] 2/9 04/03/2018 04/03/2018 10/04/2017 01/01/2016  Decreased Interest 2 2 0 0  Down, Depressed, Hopeless 1 1 0 0  PHQ - 2 Score 3 3 0 0  Altered sleeping 3 3 - -  Tired, decreased energy 2 2 - -  Change in appetite 2 2 - -  Feeling bad or failure about yourself  1 1 - -  Trouble concentrating 1 1 - -  Moving slowly or fidgety/restless 0 0 - -  Suicidal thoughts 0 0 - -  PHQ-9 Score 12 12 - -  Difficult doing work/chores Somewhat difficult Somewhat difficult - -     Fall Risk: Fall Risk  04/03/2018 10/04/2017 01/01/2016  Falls in the past year? No Yes No  Number falls in past yr: - 1 -  Injury with Fall? - Yes -  Follow up - Education provided -     Functional Status Survey: Is the patient deaf or have difficulty hearing?: No Does the patient have difficulty seeing, even when wearing glasses/contacts?: No Does the patient have difficulty concentrating, remembering, or making decisions?: No Does the patient have difficulty walking or climbing stairs?: No Does the patient have difficulty dressing or bathing?: No Does the patient have difficulty doing errands alone such as visiting a doctor's office or shopping?: No    Assessment & Plan  1. Current mild episode of major depressive  disorder, unspecified whether recurrent (HCC)  - escitalopram (LEXAPRO) 10 MG tablet; Take 1 tablet (10 mg total) by mouth daily.  Dispense: 30 tablet; Refill: 1  2. Hyperglycemia  improved  3. Vitamin D deficiency  Continue MVI  4. Obesity (BMI 30.0-34.9)  Discussed with the patient the risk posed by an increased BMI. Discussed importance of portion control, calorie counting and at least 150 minutes of physical activity weekly. Avoid sweet beverages and drink more water. Eat at least 6 servings of fruit and vegetables daily   5. Insomnia, unspecified type  - traZODone (DESYREL) 50 MG tablet; Take 0.5-1 tablets (25-50 mg total) by mouth at bedtime as needed for sleep.  Dispense: 30 tablet; Refill: 1  6. Ear fullness, bilateral  Resume nasal steroid

## 2018-04-03 NOTE — Patient Instructions (Signed)

## 2018-06-12 ENCOUNTER — Ambulatory Visit: Payer: 59 | Admitting: Family Medicine

## 2018-06-12 ENCOUNTER — Encounter: Payer: Self-pay | Admitting: Family Medicine

## 2018-06-12 VITALS — BP 110/80 | HR 79 | Temp 98.4°F | Resp 16 | Ht 72.0 in | Wt 226.3 lb

## 2018-06-12 DIAGNOSIS — F320 Major depressive disorder, single episode, mild: Secondary | ICD-10-CM

## 2018-06-12 DIAGNOSIS — M545 Low back pain, unspecified: Secondary | ICD-10-CM

## 2018-06-12 DIAGNOSIS — G4700 Insomnia, unspecified: Secondary | ICD-10-CM

## 2018-06-12 MED ORDER — ESCITALOPRAM OXALATE 10 MG PO TABS: 10.0000 mg | ORAL_TABLET | Freq: Every day | ORAL | 0 refills | Status: DC

## 2018-06-12 MED ORDER — TRAZODONE HCL 50 MG PO TABS: 50.0000 mg | ORAL_TABLET | Freq: Every evening | ORAL | 0 refills | Status: DC | PRN

## 2018-06-12 MED ORDER — METAXALONE 800 MG PO TABS: 800.0000 mg | ORAL_TABLET | Freq: Three times a day (TID) | ORAL | 0 refills | Status: DC | PRN

## 2018-06-12 NOTE — Progress Notes (Signed)
Name: Bryan Horton   MRN: [id number]    DOB: 18-Jan-1986   Date:06/12/2018       Progress Note  Subjective  Chief Complaint  Chief Complaint  Patient presents with  . Medication Refill  . Insomnia    patient stated that he sleeps fine with the medication  . Depression    patient does not know what the medication is suppose to do  . Back Pain    patient stated that he has been having intermittent back for the past week or 2. it radiates down both legs. not sure if it is a sciatica flare or related to work, no otc meds taken.    HPI  Insomnia: he was seen 03/2018 with insomnia, she is doing well on Trazodone 50 mg , able to fall and stay asleep   Major Depression: going on for the past couple of years, seen 03/2018 and Phq9 was 12. He lost a couple of aunts and his grandmother, he states they helped raise him, he has not contacted hospice counseling yet, but he is feeling well on medication now, only side effects of lexapro was delayed in orgasm but it has resolved now. Marland Kitchen He was having  lack of energy and motivation, he has gone fishing a couple of times. He has been going out to see friends more often, not as snappy at home.   Back pain : he states he works lifting and bending at work, feels tight and stiff, and is aggravated by movement. Occasionally goes down to his anterior, not associated with weakness, no bowel or bladder incontinence. He states when radiates down his legs can happen for hours, the back is more frequent and worse after work, especially when working many days in a row. Currently on 8th day of working straight. It happens every 3 weeks.    Patient Active Problem List   Diagnosis Date Noted  . 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 Tinea versicolor 01/01/2016  . Pain in shoulder 01/01/2016  . Obesity (BMI 30.0-34.9) 01/01/2016    Past Surgical History:  Procedure Laterality Date  . CIRCUMCISION     as an infant  . HERNIA REPAIR  05/13/2009   Umbilical     Family History  Problem Relation Age of Onset  . Hypertension Mother   . Hypertension Father   . Diabetes Father   . Other Paternal Uncle     Social History   Socioeconomic History  . Marital status: Significant Other    Spouse name: Carollee Herter   . Number of children: 1  . Years of education: Not on file  . Highest education level: High school graduate  Occupational History  . Occupation: Medical sales representative: [employer]  Social Needs  . Financial resource strain: Not hard at all  . Food insecurity:    Worry: Never true    Inability: Never true  . Transportation needs:    Medical: No    Non-medical: No  Tobacco Use  . Smoking status: Former Smoker    Years: 2.00    Types: Cigars  . Smokeless tobacco: Never Used  Substance and Sexual Activity  . Alcohol use: Yes    Alcohol/week: 0.0 oz    Comment: seldom  . Drug use: No  . Sexual activity: Yes    Partners: Female    Birth control/protection: None  Lifestyle  . Physical activity:    Days per week: 3 days    Minutes per  session: 100 min  . Stress: Only a little  Relationships  . Social connections:    Talks on phone: More than three times a week    Gets together: Never    Attends religious service: More than 4 times per year    Active member of club or organization: No    Attends meetings of clubs or organizations: Never    Relationship status: Living with partner  . Intimate partner violence:    Fear of current or ex partner: No    Emotionally abused: No    Physically abused: No    Forced sexual activity: No  Other Topics Concern  . Not on file  Social History Narrative   Lives with significant for the past 10 years, they have a 696 yo son   He works 12 hours shifts, it can be day or night.     Current Outpatient Medications:  .  escitalopram (LEXAPRO) 10 MG tablet, Take 1 tablet (10 mg total) by mouth daily., Disp: 30 tablet, Rfl: 1 .  Multiple Vitamins-Minerals (MULTIVITAMIN)  tablet, Take 1 tablet by mouth daily., Disp: 30 tablet, Rfl: 0 .  traZODone (DESYREL) 50 MG tablet, Take 0.5-1 tablets (25-50 mg total) by mouth at bedtime as needed for sleep., Disp: 30 tablet, Rfl: 1  No Known Allergies   ROS  Constitutional: Negative for fever or weight change.  Respiratory: Negative for cough and shortness of breath.   Cardiovascular: Negative for chest pain or palpitations.  Gastrointestinal: Negative for abdominal pain, no bowel changes.  Musculoskeletal: Negative for gait problem or joint swelling.  Skin: Negative for rash.  Neurological: Negative for dizziness or headache.  No other specific complaints in a complete review of systems (except as listed in HPI above).  Objective  Vitals:   06/12/18 0840  BP: 110/80  Pulse: 79  Resp: 16  Temp: 98.4 F (36.9 C)  TempSrc: Oral  SpO2: 99%  Weight: 226 lb 4.8 oz (102.6 kg)  Height: 6' (1.829 m)    Body mass index is 30.69 kg/m.  Physical Exam  Constitutional: Patient appears well-developed and well-nourished. Obese  No distress.  HEENT: head atraumatic, normocephalic, pupils equal and reactive to light,  neck supple, throat within normal limits Cardiovascular: Normal rate, regular rhythm and normal heart sounds.  No murmur heard. No BLE edema. Pulmonary/Chest: Effort normal and breath sounds normal. No respiratory distress. Abdominal: Soft.  There is no tenderness. Psychiatric: Patient has a normal mood and affect. behavior is normal. Judgment and thought content normal. Muscular Skeletal: pain during palpation of lumbar spine, normal rom , spasms on para spinal muscles  PHQ2/9: [facility] 2/[facility] 06/12/2018 04/03/2018 04/03/2018 10/04/2017 01/01/2016  Decreased Interest 0 2 2 0 0  Down, Depressed, Hopeless 0 1 1 0 0  PHQ - 2 Score 0 3 3 0 0  Altered sleeping 0 3 3 - -  Tired, decreased energy 1 2 2  - -  Change in appetite 0 2 2 - -  Feeling bad or failure about yourself  0 1 1 - -  Trouble  concentrating 0 1 1 - -  Moving slowly or fidgety/restless 0 0 0 - -  Suicidal thoughts 0 0 0 - -  PHQ-9 Score 1 12 12  - -  Difficult doing work/chores Not difficult at all Somewhat difficult Somewhat difficult - -     Fall Risk: Fall Risk  06/12/2018 04/03/2018 10/04/2017 01/01/2016  Falls in the past year? No No Yes No  Number falls in past yr: - - 1 -  Injury with Fall? - - Yes -  Follow up - - Education provided -     Functional Status Survey: Is the patient deaf or have difficulty hearing?: No Does the patient have difficulty seeing, even when wearing glasses/contacts?: No Does the patient have difficulty concentrating, remembering, or making decisions?: No Does the patient have difficulty walking or climbing stairs?: No Does the patient have difficulty dressing or bathing?: No Does the patient have difficulty doing errands alone such as visiting a doctor's office or shopping?: No   Assessment & Plan  1. Current mild episode of major depressive disorder, unspecified whether recurrent (HCC)  - escitalopram (LEXAPRO) 10 MG tablet; Take 1 tablet (10 mg total) by mouth daily.  Dispense: 90 tablet; Refill: 0  2. Insomnia, unspecified type  - traZODone (DESYREL) 50 MG tablet; Take 1 tablet (50 mg total) by mouth at bedtime as needed for sleep.  Dispense: 90 tablet; Refill: 0  3. Acute bilateral low back pain without sciatica  - metaxalone (SKELAXIN) 800 MG tablet; Take 1 tablet (800 mg total) by mouth 3 (three) times daily as needed for muscle spasms. Back spasms  Dispense: 90 tablet; Refill: 0

## 2018-06-12 NOTE — Patient Instructions (Signed)

## 2018-06-21 ENCOUNTER — Ambulatory Visit: Payer: 59 | Admitting: Family Medicine

## 2018-06-21 ENCOUNTER — Encounter: Payer: Self-pay | Admitting: Family Medicine

## 2018-06-21 VITALS — BP 120/76 | HR 76 | Temp 98.9°F | Resp 16 | Ht 72.0 in | Wt 223.2 lb

## 2018-06-21 DIAGNOSIS — N342 Other urethritis: Secondary | ICD-10-CM

## 2018-06-21 DIAGNOSIS — R350 Frequency of micturition: Secondary | ICD-10-CM | POA: Diagnosis not present

## 2018-06-21 LAB — POCT URINALYSIS DIPSTICK
Bilirubin, UA: NEGATIVE
GLUCOSE UA: NEGATIVE
KETONES UA: NEGATIVE
Leukocytes, UA: NEGATIVE
Nitrite, UA: NEGATIVE
Protein, UA: NEGATIVE
RBC UA: NEGATIVE
SPEC GRAV UA: 1.01 (ref 1.010–1.025)
Urobilinogen, UA: 0.2 E.U./dL
pH, UA: 5 (ref 5.0–8.0)

## 2018-06-21 MED ORDER — AZITHROMYCIN 500 MG PO TABS: 1000.0000 mg | ORAL_TABLET | Freq: Once | ORAL | Status: DC

## 2018-06-21 NOTE — Progress Notes (Signed)
Name: Bryan Horton   MRN: [id number]    DOB: 09-10-1986   Date:06/21/2018       Progress Note  Subjective  Chief Complaint  Chief Complaint  Patient presents with  . Urinary Frequency    HPI  Pt presents with concern for urinary frequency/urgency, and left lower quadrant discomfort.  He denies dysuria, penile discharge or bleeding, no frank hematuria.  He was seen recently by PCP for low back pain - this has been improving with muscle relaxer.  He has had no new sexual partners - one male partner.  Last visit 3/82019 he was seen for scrotal pain and was never contacted about the US of his scrotum so he did not have this done; his gonorrhea/chlamydia, and urine culture were negative at that time.  Patient Active Problem List   Diagnosis Date Noted  . 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 Tinea versicolor 01/01/2016  . Pain in shoulder 01/01/2016  . Obesity (BMI 30.0-34.9) 01/01/2016    Social History   Tobacco Use  . Smoking status: Former Smoker    Years: 2.00    Types: Cigars  . Smokeless tobacco: Never Used  Substance Use Topics  . Alcohol use: Yes    Alcohol/week: 0.0 oz    Comment: seldom     Current Outpatient Medications:  .  escitalopram (LEXAPRO) 10 MG tablet, Take 1 tablet (10 mg total) by mouth daily., Disp: 90 tablet, Rfl: 0 .  metaxalone (SKELAXIN) 800 MG tablet, Take 1 tablet (800 mg total) by mouth 3 (three) times daily as needed for muscle spasms. Back spasms, Disp: 90 tablet, Rfl: 0 .  Multiple Vitamins-Minerals (MULTIVITAMIN) tablet, Take 1 tablet by mouth daily., Disp: 30 tablet, Rfl: 0 .  traZODone (DESYREL) 50 MG tablet, Take 1 tablet (50 mg total) by mouth at bedtime as needed for sleep., Disp: 90 tablet, Rfl: 0  No Known Allergies  ROS  Constitutional: Negative for fever or weight change.  Respiratory: Negative for cough and shortness of breath.   Cardiovascular: Negative for chest pain or palpitations.  Gastrointestinal: Negative for  abdominal pain, no bowel changes.  Musculoskeletal: Negative for gait problem or joint swelling.  Skin: Negative for rash.  Neurological: Negative for dizziness or headache.  No other specific complaints in a complete review of systems (except as listed in HPI above). GU: see above  Objective  Vitals:   06/21/18 0900  BP: 120/76  Pulse: 76  Resp: 16  Temp: 98.9 F (37.2 C)  TempSrc: Oral  SpO2: 96%  Weight: 223 lb 3.2 oz (101.2 kg)  Height: 6' (1.829 m)   Body mass index is 30.27 kg/m.  Nursing Note and Vital Signs reviewed.  Physical Exam  Constitutional: He is oriented to person, place, and time. He appears well-developed and well-nourished.  HENT:  Head: Normocephalic and atraumatic.  Right Ear: Tympanic membrane, external ear and ear canal normal.  Left Ear: Tympanic membrane, external ear and ear canal normal.  Nose: Nose normal.  Mouth/Throat: Uvula is midline and mucous membranes are normal. No tonsillar exudate.  Eyes: Conjunctivae and EOM are normal. No scleral icterus.  Neck: Normal range of motion. Neck supple.  Cardiovascular: Normal rate, regular rhythm and normal heart sounds.  Pulmonary/Chest: Effort normal and breath sounds normal. No respiratory distress.  Abdominal: Soft. Bowel sounds are normal. He exhibits no mass. There is no tenderness. There is no rebound.  Genitourinary:  Genitourinary Comments: Deferred  Musculoskeletal: Normal range of motion. He exhibits  no edema.  Neurological: He is alert and oriented to person, place, and time. No cranial nerve deficit.  Skin: Skin is warm and dry. No rash noted. No erythema.  Psychiatric: He has a normal mood and affect. His behavior is normal. Judgment and thought content normal.  Nursing note and vitals reviewed.  No results found for this or any previous visit (from the past 72 hour(s)).  Assessment & Plan  1. Urethritis - We will treat for non-gonococcal urethritis today, and he is agreeable to  return if testing is positive for additional treatment. - azithromycin (ZITHROMAX) tablet 1,000 mg  - Urine Culture - C. trachomatis/N. gonorrhoeae RNA  2. Urine frequency - POCT urinalysis dipstick - azithromycin (ZITHROMAX) tablet 1,000 mg - Urine Culture - C. trachomatis/N. gonorrhoeae RNA  - Advised to call our office in 5 days if symptoms are not improving.

## 2018-06-22 LAB — URINE CULTURE
MICRO NUMBER:: 90875863
RESULT: NO GROWTH
SPECIMEN QUALITY:: ADEQUATE

## 2018-06-22 LAB — C. TRACHOMATIS/N. GONORRHOEAE RNA
C. trachomatis RNA, TMA: NOT DETECTED
N. gonorrhoeae RNA, TMA: NOT DETECTED

## 2018-09-11 ENCOUNTER — Other Ambulatory Visit: Payer: Self-pay | Admitting: Family Medicine

## 2018-09-11 DIAGNOSIS — G4700 Insomnia, unspecified: Secondary | ICD-10-CM

## 2018-09-11 DIAGNOSIS — F320 Major depressive disorder, single episode, mild: Secondary | ICD-10-CM

## 2018-09-25 ENCOUNTER — Other Ambulatory Visit: Payer: Self-pay | Admitting: Family Medicine

## 2018-09-25 DIAGNOSIS — F320 Major depressive disorder, single episode, mild: Secondary | ICD-10-CM

## 2018-09-26 ENCOUNTER — Other Ambulatory Visit: Payer: Self-pay | Admitting: Family Medicine

## 2018-09-26 DIAGNOSIS — F320 Major depressive disorder, single episode, mild: Secondary | ICD-10-CM

## 2018-09-26 MED ORDER — ESCITALOPRAM OXALATE 10 MG PO TABS: 10.0000 mg | ORAL_TABLET | Freq: Every day | ORAL | 0 refills | Status: DC

## 2018-09-26 NOTE — Addendum Note (Signed)
Addended by: Alba Cory F on: 09/26/2018 02:19 PM   Modules accepted: Orders

## 2018-10-10 ENCOUNTER — Encounter: Payer: Self-pay | Admitting: Family Medicine

## 2018-10-10 ENCOUNTER — Ambulatory Visit (INDEPENDENT_AMBULATORY_CARE_PROVIDER_SITE_OTHER): Payer: 59 | Admitting: Family Medicine

## 2018-10-10 VITALS — BP 122/82 | HR 66 | Temp 98.8°F | Resp 16 | Ht 72.0 in | Wt 246.7 lb

## 2018-10-10 DIAGNOSIS — Z13220 Encounter for screening for lipoid disorders: Secondary | ICD-10-CM | POA: Diagnosis not present

## 2018-10-10 DIAGNOSIS — Z131 Encounter for screening for diabetes mellitus: Secondary | ICD-10-CM | POA: Diagnosis not present

## 2018-10-10 DIAGNOSIS — Z79899 Other long term (current) drug therapy: Secondary | ICD-10-CM

## 2018-10-10 DIAGNOSIS — G4700 Insomnia, unspecified: Secondary | ICD-10-CM

## 2018-10-10 DIAGNOSIS — B360 Pityriasis versicolor: Secondary | ICD-10-CM

## 2018-10-10 DIAGNOSIS — E559 Vitamin D deficiency, unspecified: Secondary | ICD-10-CM

## 2018-10-10 DIAGNOSIS — R635 Abnormal weight gain: Secondary | ICD-10-CM

## 2018-10-10 DIAGNOSIS — S8992XA Unspecified injury of left lower leg, initial encounter: Secondary | ICD-10-CM

## 2018-10-10 DIAGNOSIS — Z1159 Encounter for screening for other viral diseases: Secondary | ICD-10-CM

## 2018-10-10 DIAGNOSIS — Z0000 Encounter for general adult medical examination without abnormal findings: Secondary | ICD-10-CM | POA: Diagnosis not present

## 2018-10-10 DIAGNOSIS — F320 Major depressive disorder, single episode, mild: Secondary | ICD-10-CM

## 2018-10-10 MED ORDER — TRAZODONE HCL 50 MG PO TABS: 50.0000 mg | ORAL_TABLET | Freq: Every evening | ORAL | 1 refills | Status: DC | PRN

## 2018-10-10 MED ORDER — SELENIUM SULFIDE 2.25 % EX SHAM: 15.0000 mL | MEDICATED_SHAMPOO | Freq: Every day | CUTANEOUS | 2 refills | Status: DC

## 2018-10-10 MED ORDER — ESCITALOPRAM OXALATE 10 MG PO TABS: 10.0000 mg | ORAL_TABLET | Freq: Every day | ORAL | 1 refills | Status: DC

## 2018-10-10 NOTE — Patient Instructions (Signed)
Preventive Care 18-39 Years, Male Preventive care refers to lifestyle choices and visits with your health care provider that can promote health and wellness. What does preventive care include?  A yearly physical exam. This is also called an annual well check.  Dental exams once or twice a year.  Routine eye exams. Ask your health care provider how often you should have your eyes checked.  Personal lifestyle choices, including: ? Daily care of your teeth and gums. ? Regular physical activity. ? Eating a healthy diet. ? Avoiding tobacco and drug use. ? Limiting alcohol use. ? Practicing safe sex. What happens during an annual well check? The services and screenings done by your health care provider during your annual well check will depend on your age, overall health, lifestyle risk factors, and family history of disease. Counseling Your health care provider may ask you questions about your:  Alcohol use.  Tobacco use.  Drug use.  Emotional well-being.  Home and relationship well-being.  Sexual activity.  Eating habits.  Work and work Statistician.  Screening You may have the following tests or measurements:  Height, weight, and BMI.  Blood pressure.  Lipid and cholesterol levels. These may be checked every 5 years starting at age 34.  Diabetes screening. This is done by checking your blood sugar (glucose) after you have not eaten for a while (fasting).  Skin check.  Hepatitis C blood test.  Hepatitis B blood test.  Sexually transmitted disease (STD) testing.  Discuss your test results, treatment options, and if necessary, the need for more tests with your health care provider. Vaccines Your health care provider may recommend certain vaccines, such as:  Influenza vaccine. This is recommended every year.  Tetanus, diphtheria, and acellular pertussis (Tdap, Td) vaccine. You may need a Td booster every 10 years.  Varicella vaccine. You may need this if you  have not been vaccinated.  HPV vaccine. If you are 23 or younger, you may need three doses over 6 months.  Measles, mumps, and rubella (MMR) vaccine. You may need at least one dose of MMR.You may also need a second dose.  Pneumococcal 13-valent conjugate (PCV13) vaccine. You may need this if you have certain conditions and have not been vaccinated.  Pneumococcal polysaccharide (PPSV23) vaccine. You may need one or two doses if you smoke cigarettes or if you have certain conditions.  Meningococcal vaccine. One dose is recommended if you are age 65-21 years and a first-year college student living in a residence hall, or if you have one of several medical conditions. You may also need additional booster doses.  Hepatitis A vaccine. You may need this if you have certain conditions or if you travel or work in places where you may be exposed to hepatitis A.  Hepatitis B vaccine. You may need this if you have certain conditions or if you travel or work in places where you may be exposed to hepatitis B.  Haemophilus influenzae type b (Hib) vaccine. You may need this if you have certain risk factors.  Talk to your health care provider about which screenings and vaccines you need and how often you need them. This information is not intended to replace advice given to you by your health care provider. Make sure you discuss any questions you have with your health care provider. Document Released: 01/11/2002 Document Revised: 08/04/2016 Document Reviewed: 09/16/2015 Elsevier Interactive Patient Education  [employer].
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
## 2018-10-11 LAB — COMPLETE METABOLIC PANEL WITH GFR
AG Ratio: 1.5 (calc) (ref 1.0–2.5)
ALBUMIN MSPROF: 4.4 g/dL (ref 3.6–5.1)
ALKALINE PHOSPHATASE (APISO): 50 U/L (ref 40–115)
ALT: 20 U/L (ref 9–46)
AST: 20 U/L (ref 10–40)
BILIRUBIN TOTAL: 0.4 mg/dL (ref 0.2–1.2)
BUN: 14 mg/dL (ref 7–25)
CHLORIDE: 103 mmol/L (ref 98–110)
CO2: 28 mmol/L (ref 20–32)
CREATININE: 1.08 mg/dL (ref 0.60–1.35)
Calcium: 9.6 mg/dL (ref 8.6–10.3)
GFR, Est African American: 105 mL/min/{1.73_m2} (ref >=60)
GFR, Est Non African American: 90 mL/min/{1.73_m2} (ref >=60)
GLOBULIN: 3 g/dL (ref 1.9–3.7)
GLUCOSE: 84 mg/dL (ref 65–99)
Potassium: 3.9 mmol/L (ref 3.5–5.3)
SODIUM: 139 mmol/L (ref 135–146)
Total Protein: 7.4 g/dL (ref 6.1–8.1)

## 2018-10-11 LAB — HEMOGLOBIN A1C
Hgb A1c MFr Bld: 5.5 % of total Hgb (ref <5.7)
MEAN PLASMA GLUCOSE: 111 (calc)
eAG (mmol/L): 6.2 (calc)

## 2018-10-11 LAB — CBC WITH DIFFERENTIAL/PLATELET
BASOS ABS: 72 {cells}/uL (ref 0–200)
Basophils Relative: 1.3 %
EOS PCT: 4.5 %
Eosinophils Absolute: 248 cells/uL (ref 15–500)
HCT: 42.5 % (ref 38.5–50.0)
HEMOGLOBIN: 14.3 g/dL (ref 13.2–17.1)
Lymphs Abs: 2184 cells/uL (ref 850–3900)
MCH: 28.1 pg (ref 27.0–33.0)
MCHC: 33.6 g/dL (ref 32.0–36.0)
MCV: 83.7 fL (ref 80.0–100.0)
MONOS PCT: 7.6 %
MPV: 10.4 fL (ref 7.5–12.5)
Neutro Abs: 2580 cells/uL (ref 1500–7800)
Neutrophils Relative %: 46.9 %
Platelets: 286 10*3/uL (ref 140–400)
RBC: 5.08 10*6/uL (ref 4.20–5.80)
RDW: 12.7 % (ref 11.0–15.0)
Total Lymphocyte: 39.7 %
WBC mixed population: 418 cells/uL (ref 200–950)
WBC: 5.5 10*3/uL (ref 3.8–10.8)

## 2018-10-11 LAB — HEPATITIS C ANTIBODY
HEP C AB: NONREACTIVE
SIGNAL TO CUT-OFF: 0.01 (ref <1.00)

## 2018-10-11 LAB — LIPID PANEL
CHOLESTEROL: 161 mg/dL (ref <200)
HDL: 48 mg/dL (ref >40)
LDL Cholesterol (Calc): 99 mg/dL (calc)
Non-HDL Cholesterol (Calc): 113 mg/dL (calc) (ref <130)
Total CHOL/HDL Ratio: 3.4 (calc) (ref <5.0)
Triglycerides: 57 mg/dL (ref <150)

## 2018-10-12 DIAGNOSIS — M25462 Effusion, left knee: Secondary | ICD-10-CM | POA: Diagnosis not present

## 2019-04-10 ENCOUNTER — Ambulatory Visit: Payer: 59 | Admitting: Family Medicine

## 2019-04-12 ENCOUNTER — Other Ambulatory Visit: Payer: Self-pay

## 2019-04-12 ENCOUNTER — Ambulatory Visit: Payer: 59 | Admitting: Family Medicine

## 2019-10-12 ENCOUNTER — Ambulatory Visit (INDEPENDENT_AMBULATORY_CARE_PROVIDER_SITE_OTHER): Payer: 59 | Admitting: Family Medicine

## 2019-10-12 ENCOUNTER — Encounter: Payer: Self-pay | Admitting: Family Medicine

## 2019-10-12 ENCOUNTER — Other Ambulatory Visit: Payer: Self-pay

## 2019-10-12 VITALS — BP 122/82 | HR 72 | Temp 97.5°F | Resp 16 | Ht 72.0 in | Wt 229.1 lb

## 2019-10-12 DIAGNOSIS — E669 Obesity, unspecified: Secondary | ICD-10-CM

## 2019-10-12 DIAGNOSIS — Z13220 Encounter for screening for lipoid disorders: Secondary | ICD-10-CM

## 2019-10-12 DIAGNOSIS — Z0000 Encounter for general adult medical examination without abnormal findings: Secondary | ICD-10-CM | POA: Diagnosis not present

## 2019-10-12 DIAGNOSIS — F411 Generalized anxiety disorder: Secondary | ICD-10-CM

## 2019-10-12 DIAGNOSIS — B360 Pityriasis versicolor: Secondary | ICD-10-CM | POA: Diagnosis not present

## 2019-10-12 DIAGNOSIS — Z131 Encounter for screening for diabetes mellitus: Secondary | ICD-10-CM

## 2019-10-12 DIAGNOSIS — G4700 Insomnia, unspecified: Secondary | ICD-10-CM

## 2019-10-12 DIAGNOSIS — Z113 Encounter for screening for infections with a predominantly sexual mode of transmission: Secondary | ICD-10-CM

## 2019-10-12 DIAGNOSIS — F325 Major depressive disorder, single episode, in full remission: Secondary | ICD-10-CM

## 2019-10-12 MED ORDER — TRAZODONE HCL 50 MG PO TABS: 50.0000 mg | ORAL_TABLET | Freq: Every evening | ORAL | 1 refills | Status: DC | PRN

## 2019-10-12 MED ORDER — ESCITALOPRAM OXALATE 10 MG PO TABS: 10.0000 mg | ORAL_TABLET | Freq: Every day | ORAL | 1 refills | Status: DC

## 2019-10-12 NOTE — Progress Notes (Signed)
Name: Bryan Horton   MRN: [id number]    DOB: 1986/02/22   Date:10/12/2019       Progress Note  Subjective  Chief Complaint  Chief Complaint  Patient presents with  . Annual Exam    HPI  Patient presents for annual CPE   Insomnia: he works shifts, either day or night, he is unable to stay asleep. He is getting at most 5 hours of sleep when he is working night shifts. He takes Trazodone prn   Tinea versicolor: he tried a topical medication but it was too costly. He also has keloid, not bothered by it.   Major depression and GAD: he is more anxious than depressed, seems to be doing well in terms of depression, but feels anxious and nervous all the time, worries about the neighborhood. He is having relationship problems.   Obesity: he has lost weight since last visit, he is back to his baseline. He states he has been walking more at work   USPSTF grade A and B recommendations:  Diet: skips meals,  Exercise: needs to increase physical activity   Depression: phq 9 is negative Depression screen [facility] 2/9 10/12/2019 10/10/2018 06/21/2018 06/12/2018 04/03/2018  Decreased Interest 0 0 0 0 2  Down, Depressed, Hopeless 0 0 0 0 1  PHQ - 2 Score 0 0 0 0 3  Altered sleeping 2 0 0 0 3  Tired, decreased energy 0 0 0 1 2  Change in appetite 2 3 0 0 2  Feeling bad or failure about yourself  0 0 0 0 1  Trouble concentrating 0 0 0 0 1  Moving slowly or fidgety/restless 0 0 0 0 0  Suicidal thoughts 0 0 0 0 0  PHQ-9 Score 4 3 0 1 12  Difficult doing work/chores Not difficult at all Not difficult at all Not difficult at all Not difficult at all Somewhat difficult    Hypertension:  BP Readings from Last 3 Encounters:  10/12/19 122/82  10/10/18 122/82  06/21/18 120/76    Obesity: Wt Readings from Last 3 Encounters:  10/12/19 229 lb 1.6 oz (103.9 kg)  10/10/18 246 lb 11.2 oz (111.9 kg)  06/21/18 223 lb 3.2 oz (101.2 kg)   BMI Readings from Last 3 Encounters:  10/12/19 31.07 kg/m   10/10/18 33.46 kg/m  06/21/18 30.27 kg/m     Lipids:  Lab Results  Component Value Date   CHOL 161 10/10/2018   CHOL 159 10/04/2017   CHOL 155 01/01/2016   Lab Results  Component Value Date   HDL 48 10/10/2018   HDL 58 10/04/2017   HDL 60 01/01/2016   Lab Results  Component Value Date   LDLCALC 99 10/10/2018   LDLCALC 87 10/04/2017   LDLCALC 87 01/01/2016   Lab Results  Component Value Date   TRIG 57 10/10/2018   TRIG 46 10/04/2017   TRIG 39 01/01/2016   Lab Results  Component Value Date   CHOLHDL 3.4 10/10/2018   CHOLHDL 2.7 10/04/2017   CHOLHDL 2.6 01/01/2016   No results found for: LDLDIRECT Glucose:  Glucose  Date Value Ref Range Status  01/01/2016 88 65 - 99 mg/dL Final   Glucose, Bld  Date Value Ref Range Status  10/10/2018 84 65 - 99 mg/dL Final    Comment:    .            Fasting reference interval .   10/04/2017 90 65 - 99 mg/dL Final    Comment:    .  Fasting reference interval .       [facility]  AUDIT-C Score  2       Significant Other STD testing and prevention (HIV/chl/gon/syphilis): he would like to be checked  Hep C: 2019   Skin cancer: discussed atypical lesions Colorectal cancer: start at 33 yo  Prostate cancer: discussed USPTF   IPSS Questionnaire (AUA-7): Over the past month.   1)  How often have you had a sensation of not emptying your bladder completely after you finish urinating?  1 - Less than 1 time in 5  2)  How often have you had to urinate again less than two hours after you finished urinating? 1 - Less than 1 time in 5  3)  How often have you found you stopped and started again several times when you urinated?  0 - Not at all  4) How difficult have you found it to postpone urination?  0 - Not at all  5) How often have you had a weak urinary stream?  0 - Not at all  6) How often have you had to push or strain to begin urination?  0 - Not at all   7) How many times did you most typically get up to urinate from the time you went to bed until the time you got up in the morning?  0 - None  Total score:  0-7 mildly symptomatic   8-19 moderately symptomatic   20-35 severely symptomatic     Advanced Care Planning: A voluntary discussion about advance care planning including the explanation and discussion of advance directives.  Discussed health care proxy and Living will, and the patient was able to identify a health care proxy as father  .  Patient does not have a living will at present time.   Patient Active Problem List   Diagnosis Date Noted  . 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 Tinea versicolor 01/01/2016  . Pain in shoulder 01/01/2016  . Obesity (BMI 30.0-34.9) 01/01/2016    Past Surgical History:  Procedure Laterality Date  . CIRCUMCISION     as an infant  . HERNIA REPAIR  05/13/2009   Umbilical    Family History  Problem Relation Age of Onset  . Hypertension Mother   . Hypertension Father   . Diabetes Father   . Other Paternal Uncle     Social History   Socioeconomic History  . Marital status: Significant Other    Spouse name: Carollee HerterShannon   . Number of children: 1  . Years of education: Not on file  . Highest education level: High school graduate  Occupational History  . Occupation: Corporate treasurerutilities maintenance     Employer: [employer]  Social Needs  . Financial resource strain: Not hard at all  . Food insecurity    Worry: Never true    Inability: Never true  . Transportation needs    Medical: No    Non-medical: No  Tobacco Use  . Smoking status: Former Smoker    Years: 2.00    Types: Cigars  . Smokeless tobacco: Never Used  . Tobacco comment: he still smokes occasionally   Substance and Sexual Activity  . Alcohol use: Yes    Alcohol/week: 0.0 standard drinks    Comment: seldom  . Drug use: No  . Sexual activity: Yes    Partners: Female    Birth control/protection: None  Lifestyle  .  Physical activity  Days per week: 2 days    Minutes per session: 60 min  . Stress: Very much  Relationships  . Social connections    Talks on phone: More than three times a week    Gets together: Never    Attends religious service: More than 4 times per year    Active member of club or organization: No    Attends meetings of clubs or organizations: Never    Relationship status: Living with partner  . Intimate partner violence    Fear of current or ex partner: No    Emotionally abused: No    Physically abused: No    Forced sexual activity: No  Other Topics Concern  . Not on file  Social History Narrative   Lives with significant for the past 12 years, they have a 28 yo son   He works 12 hours shifts, it can be day or night.   Patient closed on their first home 10/07/2018      Current Outpatient Medications:  .  escitalopram (LEXAPRO) 10 MG tablet, Take 1 tablet (10 mg total) by mouth daily., Disp: 90 tablet, Rfl: 1 .  traZODone (DESYREL) 50 MG tablet, Take 1 tablet (50 mg total) by mouth at bedtime as needed for sleep., Disp: 90 tablet, Rfl: 1  No Known Allergies   ROS   Constitutional: Negative for fever , positive for weight change.  Respiratory: Negative for cough and shortness of breath.   Cardiovascular: Negative for chest pain or palpitations.  Gastrointestinal: Negative for abdominal pain, no bowel changes.  Musculoskeletal: Negative for gait problem or joint swelling.  Skin: positive for rash.  Neurological: Negative for dizziness or headache.  No other specific complaints in a complete review of systems (except as listed in HPI above).   Objective   Vitals:   10/12/19 0925  BP: 122/82  Pulse: 72  Resp: 16  Temp: (!) 97.5 F (36.4 C)  TempSrc: Temporal  SpO2: 99%  Weight: 229 lb 1.6 oz (103.9 kg)  Height: 6' (1.829 m)    Body mass index is 31.07 kg/m.  Physical Exam  Constitutional: Patient appears well-developed and well-nourished. No  distress.  HENT: Head: Normocephalic and atraumatic. Ears: B TMs ok, no erythema or effusion; Nose: Nose normal. Mouth/Throat: Oropharynx is clear and moist. No oropharyngeal exudate.  Eyes: Conjunctivae and EOM are normal. Pupils are equal, round, and reactive to light. No scleral icterus.  Neck: Normal range of motion. Neck supple. No JVD present. No thyromegaly present.  Cardiovascular: Normal rate, regular rhythm and normal heart sounds.  No murmur heard. No BLE edema. Pulmonary/Chest: Effort normal and breath sounds normal. No respiratory distress. Abdominal: Soft. Bowel sounds are normal, no distension. There is no tenderness. no masses MALE GENITALIA: Normal descended testes bilaterally, no masses palpated, no hernias, no lesions, no discharge RECTAL: Prostate normal size and consistency, no rectal masses or hemorrhoids Musculoskeletal: Normal range of motion, no joint effusions. No gross deformities Neurological: he is alert and oriented to person, place, and time. No cranial nerve deficit. Coordination, balance, strength, speech and gait are normal.  Skin: hypopigmentation on both arms and upper back, also has keloid on ears and right jaw line  Psychiatric: Patient has a normal mood and affect. behavior is normal. Judgment and thought content normal.  GAD 7 : Generalized Anxiety Score 10/12/2019 10/10/2018 04/03/2018 04/03/2018  Nervous, Anxious, on Edge 2 0 0 0  Control/stop worrying 3 1 1 1   Worry too much - different  things 3 0 1 1  Trouble relaxing 3 1 1 1   Restless 3 0 1 1  Easily annoyed or irritable 2 1 1 1   Afraid - awful might happen 2 0 0 0  Total GAD 7 Score 18 3 5 5   Anxiety Difficulty - Not difficult at all Not difficult at all Not difficult at all     Fall Risk: Fall Risk  10/12/2019 10/10/2018 06/21/2018 06/12/2018 04/03/2018  Falls in the past year? 0 1 No No No  Number falls in past yr: 0 0 - - -  Injury with Fall? 0 1 - - -  Follow up - - - - -     Assessment &  Plan  1. Routine general medical examination at a health care facility  - Lipid panel - Hemoglobin A1c - CBC with Differential - Comprehensive metabolic panel  2. Lipid screening  Lipid panel   3. Diabetes mellitus screening  A1C  4. Tinea versicolor  He will try otc selsum blue shampoo   5. Obesity (BMI 30.0-34.9)  Getting more active and has been eating better   6. Insomnia, unspecified type  - traZODone (DESYREL) 50 MG tablet; Take 1 tablet (50 mg total) by mouth at bedtime as needed for sleep.  Dispense: 90 tablet; Refill: 1  7. Major depression in remission (HCC)  - escitalopram (LEXAPRO) 10 MG tablet; Take 1 tablet (10 mg total) by mouth daily.  Dispense: 90 tablet; Refill: 1  8. GAD (generalized anxiety disorder)  - escitalopram (LEXAPRO) 10 MG tablet; Take 1 tablet (10 mg total) by mouth daily.  Dispense: 90 tablet; Refill: 1   9. Routine screening for STI (sexually transmitted infection)  - HIV Antibody (routine testing w rflx) - RPR - GC Probe amplification, urine  -Prostate cancer screening and PSA options (with potential risks and benefits of testing vs not testing) were discussed along with recent recs/guidelines. -USPSTF grade A and B recommendations reviewed with patient; age-appropriate recommendations, preventive care, screening tests, etc discussed and encouraged; healthy living encouraged; see AVS for patient education given to patient -Discussed importance of 150 minutes of physical activity weekly, eat two servings of fish weekly, eat one serving of tree nuts ( cashews, pistachios, pecans, almonds.06/14/2018) every other day, eat 6 servings of fruit/vegetables daily and drink plenty of water and avoid sweet beverages.

## 2019-10-12 NOTE — Patient Instructions (Signed)

## 2019-10-15 ENCOUNTER — Other Ambulatory Visit (HOSPITAL_COMMUNITY)
Admission: RE | Admit: 2019-10-15 | Discharge: 2019-10-15 | Disposition: A | Discharge status: Home / Self Care | Payer: 59 | Source: Ambulatory Visit | Attending: Family Medicine | Admitting: Family Medicine

## 2019-10-15 DIAGNOSIS — Z113 Encounter for screening for infections with a predominantly sexual mode of transmission: Secondary | ICD-10-CM | POA: Insufficient documentation

## 2019-10-15 LAB — HIV ANTIBODY (ROUTINE TESTING W REFLEX): HIV 1&2 Ab, 4th Generation: NONREACTIVE

## 2019-10-15 LAB — RPR: RPR Ser Ql: NONREACTIVE

## 2019-10-15 NOTE — Addendum Note (Signed)
Addended by: Marland Kitchen A on: 10/15/2019 12:03 PM   Modules accepted: Orders

## 2019-10-16 LAB — URINE CYTOLOGY ANCILLARY ONLY
Chlamydia: NEGATIVE
Comment: NEGATIVE
Comment: NORMAL
Neisseria Gonorrhea: NEGATIVE

## 2020-04-10 ENCOUNTER — Ambulatory Visit: Payer: 59 | Admitting: Family Medicine

## 2020-07-11 ENCOUNTER — Ambulatory Visit: Payer: Self-pay

## 2020-07-11 ENCOUNTER — Other Ambulatory Visit: Payer: Self-pay

## 2020-07-11 ENCOUNTER — Other Ambulatory Visit: Payer: Self-pay | Admitting: Family Medicine

## 2020-07-11 DIAGNOSIS — Z0000 Encounter for general adult medical examination without abnormal findings: Secondary | ICD-10-CM

## 2020-10-10 NOTE — Progress Notes (Deleted)
Name: Bryan Horton   MRN: [id number]    DOB: 12-12-1985   Date:10/10/2020       Progress Note  Subjective  Chief Complaint  Annual Exam   HPI  Patient presents for annual CPE ***.  IPSS Questionnaire (AUA-7): Over the past month.   1)  How often have you had a sensation of not emptying your bladder completely after you finish urinating?  {Rating:19227}  2)  How often have you had to urinate again less than two hours after you finished urinating? {Rating:19227}  3)  How often have you found you stopped and started again several times when you urinated?  {Rating:19227}  4) How difficult have you found it to postpone urination?  {Rating:19227}  5) How often have you had a weak urinary stream?  {Rating:19227}  6) How often have you had to push or strain to begin urination?  {Rating:19227}  7) How many times did you most typically get up to urinate from the time you went to bed until the time you got up in the morning?  {Rating:19228}  Total score:  0-7 mildly symptomatic   8-19 moderately symptomatic   20-35 severely symptomatic     Diet: *** Exercise: ***  Depression: phq 9 is {gen pos UMP:536144} Depression screen [facility] 2/9 10/12/2019 10/10/2018 06/21/2018 06/12/2018 04/03/2018  Decreased Interest 0 0 0 0 2  Down, Depressed, Hopeless 0 0 0 0 1  PHQ - 2 Score 0 0 0 0 3  Altered sleeping 2 0 0 0 3  Tired, decreased energy 0 0 0 1 2  Change in appetite 2 3 0 0 2  Feeling bad or failure about yourself  0 0 0 0 1  Trouble concentrating 0 0 0 0 1  Moving slowly or fidgety/restless 0 0 0 0 0  Suicidal thoughts 0 0 0 0 0  PHQ-9 Score 4 3 0 1 12  Difficult doing work/chores Not difficult at all Not difficult at all Not difficult at all Not difficult at all Somewhat difficult    Hypertension:  BP Readings from Last 3 Encounters:  10/12/19 122/82  10/10/18 122/82  06/21/18 120/76    Obesity: Wt Readings from Last 3 Encounters:  10/12/19 229 lb 1.6 oz (103.9 kg)  10/10/18 246 lb  11.2 oz (111.9 kg)  06/21/18 223 lb 3.2 oz (101.2 kg)   BMI Readings from Last 3 Encounters:  10/12/19 31.07 kg/m  10/10/18 33.46 kg/m  06/21/18 30.27 kg/m     Lipids:  Lab Results  Component Value Date   CHOL 161 10/10/2018   CHOL 159 10/04/2017   CHOL 155 01/01/2016   Lab Results  Component Value Date   HDL 48 10/10/2018   HDL 58 10/04/2017   HDL 60 01/01/2016   Lab Results  Component Value Date   LDLCALC 99 10/10/2018   LDLCALC 87 10/04/2017   LDLCALC 87 01/01/2016   Lab Results  Component Value Date   TRIG 57 10/10/2018   TRIG 46 10/04/2017   TRIG 39 01/01/2016   Lab Results  Component Value Date   CHOLHDL 3.4 10/10/2018   CHOLHDL 2.7 10/04/2017   CHOLHDL 2.6 01/01/2016   No results found for: LDLDIRECT Glucose:  Glucose  Date Value Ref Range Status  01/01/2016 88 65 - 99 mg/dL Final   Glucose, Bld  Date Value Ref Range Status  10/10/2018 84 65 - 99 mg/dL Final    Comment:    .  Fasting reference interval .   10/04/2017 90 65 - 99 mg/dL Final    Comment:    .            Fasting reference interval .       [facility] 10[facility]10/[facility] in [facility]  AUDIT-C Score 2     ***  Significant Other STD testing and prevention (HIV/chl/gon/syphilis):  Hep C: 10/10/2018  Skin cancer: Discussed monitoring for atypical lesions Colorectal cancer: N/A Prostate cancer: N/A No results found for: PSA   Lung cancer:  *** Low Dose CT Chest recommended if Age 73-80 years, 30 pack-year currently smoking OR have quit w/in 15years. Patient does not qualify.   AAA: *** The USPSTF recommends one-time screening with ultrasonography in men ages 43 to 39 years who have ever smoked ECG: N/A  Vaccines:  HPV: up to at age 74 , ask insurance if age between 38-45  Shingrix: 62-64 yo and ask insurance if covered when patient above 69 yo Pneumonia: N/A educated and discussed with patient. Flu: Given at today's visit,  educated and  discussed with patient.  Advanced Care Planning: A voluntary discussion about advance care planning including the explanation and discussion of advance directives.  Discussed health care proxy and Living will, and the patient was able to identify a health care proxy as ***.  Patient does not have a living will at present time. If patient does have living will, I have requested they bring this to the clinic to be scanned in to their chart.  Patient Active Problem List   Diagnosis Date Noted  . 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 Tinea versicolor 01/01/2016  . Pain in shoulder 01/01/2016  . Obesity (BMI 30.0-34.9) 01/01/2016    Past Surgical History:  Procedure Laterality Date  . CIRCUMCISION     as an infant  . HERNIA REPAIR  05/13/2009   Umbilical    Family History  Problem Relation Age of Onset  . Hypertension Mother   . Hypertension Father   . Diabetes Father   . Other Paternal Uncle     Social History   Socioeconomic History  . Marital status: Significant Other    Spouse name: Carollee Herter   . Number of children: 1  . Years of education: Not on file  . Highest education level: High school graduate  Occupational History  . Occupation: Corporate treasurer: [employer]  Tobacco Use  . Smoking status: Former Smoker    Years: 2.00    Types: Cigars  . Smokeless tobacco: Never Used  . Tobacco comment: he still smokes occasionally   Vaping Use  . Vaping Use: Never used  Substance and Sexual Activity  . Alcohol use: Yes    Alcohol/week: 0.0 standard drinks    Comment: seldom  . Drug use: No  . Sexual activity: Yes    Partners: Female    Birth control/protection: None  Other Topics Concern  . Not on file  Social History Narrative   Lives with significant for the past 12 years, they have a 81 yo son   He works 12 hours shifts, it can be day or night.   Patient closed on their first home 10/07/2018    Social Determinants of Health   Financial  Resource Strain:   . Difficulty of Paying Living Expenses: Not on file  Food Insecurity:   . Worried About Programme researcher, broadcasting/film/video in the Last Year: Not on file  .  Ran Out of Food in the Last Year: Not on file  Transportation Needs:   . Lack of Transportation (Medical): Not on file  . Lack of Transportation (Non-Medical): Not on file  Physical Activity: Insufficiently Active  . Days of Exercise per Week: 2 days  . Minutes of Exercise per Session: 60 min  Stress:   . Feeling of Stress : Not on file  Social Connections:   . Frequency of Communication with Friends and Family: Not on file  . Frequency of Social Gatherings with Friends and Family: Not on file  . Attends Religious Services: Not on file  . Active Member of Clubs or Organizations: Not on file  . Attends Banker Meetings: Not on file  . Marital Status: Not on file  Intimate Partner Violence:   . Fear of Current or Ex-Partner: Not on file  . Emotionally Abused: Not on file  . Physically Abused: Not on file  . Sexually Abused: Not on file     Current Outpatient Medications:  .  escitalopram (LEXAPRO) 10 MG tablet, Take 1 tablet (10 mg total) by mouth daily., Disp: 90 tablet, Rfl: 1 .  traZODone (DESYREL) 50 MG tablet, Take 1 tablet (50 mg total) by mouth at bedtime as needed for sleep., Disp: 90 tablet, Rfl: 1  No Known Allergies   ROS  ***   Objective  There were no vitals filed for this visit.  There is no height or weight on file to calculate BMI.  Physical Exam ***  No results found for this or any previous visit (from the past 2160 hour(s)).   Fall Risk: Fall Risk  10/12/2019 10/10/2018 06/21/2018 06/12/2018 04/03/2018  Falls in the past year? 0 1 No No No  Number falls in past yr: 0 0 - - -  Injury with Fall? 0 1 - - -  Follow up - - - - -   ***   Functional Status Survey:   ***   Assessment & Plan  There are no diagnoses linked to this encounter.   -Prostate cancer screening  and PSA options (with potential risks and benefits of testing vs not testing) were discussed along with recent recs/guidelines. -USPSTF grade A and B recommendations reviewed with patient; age-appropriate recommendations, preventive care, screening tests, etc discussed and encouraged; healthy living encouraged; Horton AVS for patient education given to patient -Discussed importance of 150 minutes of physical activity weekly, eat two servings of fish weekly, eat one serving of tree nuts ( cashews, pistachios, pecans, almonds.Marland Kitchen) every other day, eat 6 servings of fruit/vegetables daily and drink plenty of water and avoid sweet beverages.  -Reviewed Health Maintenance: ***

## 2020-10-13 ENCOUNTER — Encounter: Payer: 59 | Admitting: Family Medicine

## 2021-01-21 NOTE — Progress Notes (Unsigned)
Name: Bryan Horton   MRN: [id number]    DOB: Jun 03, 1986   Date:01/22/2021       Progress Note  Subjective  Chief Complaint  Annual Exam   HPI  Patient presents for annual CPE   IPSS Questionnaire (AUA-7): Over the past month.   1)  How often have you had a sensation of not emptying your bladder completely after you finish urinating?  1 - Less than 1 time in 5  2)  How often have you had to urinate again less than two hours after you finished urinating? 0 - Not at all  3)  How often have you found you stopped and started again several times when you urinated?  1 - Less than 1 time in 5  4) How difficult have you found it to postpone urination?  0 - Not at all  5) How often have you had a weak urinary stream?  0 - Not at all  6) How often have you had to push or strain to begin urination?  0 - Not at all  7) How many times did you most typically get up to urinate from the time you went to bed until the time you got up in the morning?  0 - None  Total score:  0-7 mildly symptomatic   8-19 moderately symptomatic   20-35 severely symptomatic     Diet: not healthy lately  Exercise: active hunting rabbits and doing yard work   Depression: phq 9 is negative Depression screen [facility] 2/9 01/22/2021 10/12/2019 10/10/2018 06/21/2018 06/12/2018  Decreased Interest 0 0 0 0 0  Down, Depressed, Hopeless 0 0 0 0 0  PHQ - 2 Score 0 0 0 0 0  Altered sleeping 0 2 0 0 0  Tired, decreased energy 0 0 0 0 1  Change in appetite 3 2 3  0 0  Feeling bad or failure about yourself  0 0 0 0 0  Trouble concentrating 0 0 0 0 0  Moving slowly or fidgety/restless 0 0 0 0 0  Suicidal thoughts 0 0 0 0 0  PHQ-9 Score 3 4 3  0 1  Difficult doing work/chores - Not difficult at all Not difficult at all Not difficult at all Not difficult at all    Hypertension:  BP Readings from Last 3 Encounters:  01/22/21 122/72  10/12/19 122/82  10/10/18 122/82    Obesity: Wt Readings from Last 3 Encounters:  01/22/21 252  lb (114.3 kg)  10/12/19 229 lb 1.6 oz (103.9 kg)  10/10/18 246 lb 11.2 oz (111.9 kg)   BMI Readings from Last 3 Encounters:  01/22/21 34.18 kg/m  10/12/19 31.07 kg/m  10/10/18 33.46 kg/m     Lipids:  Lab Results  Component Value Date   CHOL 161 10/10/2018   CHOL 159 10/04/2017   CHOL 155 01/01/2016   Lab Results  Component Value Date   HDL 48 10/10/2018   HDL 58 10/04/2017   HDL 60 01/01/2016   Lab Results  Component Value Date   LDLCALC 99 10/10/2018   LDLCALC 87 10/04/2017   LDLCALC 87 01/01/2016   Lab Results  Component Value Date   TRIG 57 10/10/2018   TRIG 46 10/04/2017   TRIG 39 01/01/2016   Lab Results  Component Value Date   CHOLHDL 3.4 10/10/2018   CHOLHDL 2.7 10/04/2017   CHOLHDL 2.6 01/01/2016   No results found for: LDLDIRECT Glucose:  Glucose  Date Value Ref Range Status  01/01/2016 88 65 -  99 mg/dL Final   Glucose, Bld  Date Value Ref Range Status  10/10/2018 84 65 - 99 mg/dL Final    Comment:    .            Fasting reference interval .   10/04/2017 90 65 - 99 mg/dL Final    Comment:    .            Fasting reference interval .     Flowsheet Row [facility]  AUDIT-C Score 1       Significant Other STD testing and prevention (HIV/chl/gon/syphilis): he would like to recheck it today  Hep C: 10/10/2018  Skin cancer: Discussed monitoring for atypical lesions   Vaccines:  . Flu: Offered at today's visit, educated and discussed with patient.  Advanced Care Planning: A voluntary discussion about advance care planning including the explanation and discussion of advance directives.  Discussed health care proxy and Living will, and the patient was able to identify a health care proxy as parents   Patient Active Problem List   Diagnosis Date Noted  . 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 Tinea versicolor 01/01/2016  . Pain in shoulder 01/01/2016  . Obesity (BMI 30.0-34.9)  01/01/2016    Past Surgical History:  Procedure Laterality Date  . CIRCUMCISION     as an infant  . HERNIA REPAIR  05/13/2009   Umbilical    Family History  Problem Relation Age of Onset  . Hypertension Mother   . Hypertension Father   . Diabetes Father   . Other Paternal Uncle     Social History   Socioeconomic History  . Marital status: Significant Other    Spouse name: Carollee HerterShannon   . Number of children: 1  . Years of education: Not on file  . Highest education level: High school graduate  Occupational History  . Occupation: Corporate treasurerutilities maintenance     Employer: [employer]  Tobacco Use  . Smoking status: Former Smoker    Years: 2.00    Types: Cigars  . Smokeless tobacco: Never Used  . Tobacco comment: he still smokes occasionally   Vaping Use  . Vaping Use: Never used  Substance and Sexual Activity  . Alcohol use: Yes    Alcohol/week: 0.0 standard drinks    Comment: seldom  . Drug use: No  . Sexual activity: Yes    Partners: Female    Birth control/protection: None  Other Topics Concern  . Not on file  Social History Narrative   Lives with significant for the past 14 years, they have a 35 yo son   He works 12 hours shifts, it can be day or night.   Bought their house 10/07/2018    Social Determinants of Health   Financial Resource Strain: Medium Risk  . Difficulty of Paying Living Expenses: Somewhat hard  Food Insecurity: Food Insecurity Present  . Worried About Programme researcher, broadcasting/film/videounning Out of Food in the Last Year: Sometimes true  . Ran Out of Food in the Last Year: Never true  Transportation Needs: No Transportation Needs  . Lack of Transportation (Medical): No  . Lack of Transportation (Non-Medical): No  Physical Activity: Sufficiently Active  . Days of Exercise per Week: 2 days  . Minutes of Exercise per Session: 150+ min  Stress: No Stress Concern Present  . Feeling of Stress : Only a little  Social Connections: Moderately Integrated  . Frequency of  Communication with Friends and  Family: Three times a week  . Frequency of Social Gatherings with Friends and Family: Once a week  . Attends Religious Services: Never  . Active Member of Clubs or Organizations: No  . Attends Banker Meetings: More than 4 times per year  . Marital Status: Living with partner  Intimate Partner Violence: Not At Risk  . Fear of Current or Ex-Partner: No  . Emotionally Abused: No  . Physically Abused: No  . Sexually Abused: No    No current outpatient medications on file.  No Known Allergies   ROS  Constitutional: Negative for fever, positive for  weight change.  Respiratory: Negative for cough and shortness of breath.   Cardiovascular: Negative for chest pain or palpitations.  Gastrointestinal: Negative for abdominal pain, no bowel changes.  Musculoskeletal: Negative for gait problem or joint swelling.  Skin: positive  for rash.  Neurological: Negative for dizziness or headache.  No other specific complaints in a complete review of systems (except as listed in HPI above).  Objective  Vitals:   01/22/21 0910  BP: 122/72  Pulse: 78  Resp: 16  Temp: 98.1 F (36.7 C)  TempSrc: Oral  SpO2: 99%  Weight: 252 lb (114.3 kg)  Height: 6' (1.829 m)    Body mass index is 34.18 kg/m.  Physical Exam  Constitutional: Patient appears well-developed and well-nourished. No distress.  HENT: Head: Normocephalic and atraumatic. Ears: B TMs ok, no erythema or effusion; Nose: Not done  Mouth/Throat: not done  Eyes: Conjunctivae and EOM are normal. Pupils are equal, round, and reactive to light. No scleral icterus.  Neck: Normal range of motion. Neck supple. No JVD present. No thyromegaly present.  Cardiovascular: Normal rate, regular rhythm and normal heart sounds.  No murmur heard. No BLE edema. Pulmonary/Chest: Effort normal and breath sounds normal. No respiratory distress. Abdominal: Soft. Bowel sounds are normal, no distension. There is no  tenderness. no masses MALE GENITALIA: Normal descended testes bilaterally, no masses palpated, no hernias, no lesions, no discharge RECTAL: not done  Musculoskeletal: Normal range of motion, no joint effusions. No gross deformities Neurological: he is alert and oriented to person, place, and time. No cranial nerve deficit. Coordination, balance, strength, speech and gait are normal.  Skin: Skin is warm and dry. Tinea versicolor   Psychiatric: Patient has a normal mood and affect. behavior is normal. Judgment and thought content normal.  Fall Risk: Fall Risk  01/22/2021 10/12/2019 10/10/2018 06/21/2018 06/12/2018  Falls in the past year? 0 0 1 No No  Number falls in past yr: 0 0 0 - -  Injury with Fall? 0 0 1 - -  Follow up - - - - -     Functional Status Survey: Is the patient deaf or have difficulty hearing?: No Does the patient have difficulty seeing, even when wearing glasses/contacts?: No Does the patient have difficulty concentrating, remembering, or making decisions?: No Does the patient have difficulty walking or climbing stairs?: No Does the patient have difficulty dressing or bathing?: No Does the patient have difficulty doing errands alone such as visiting a doctor's office or shopping?: No    Assessment & Plan  1. Well adult health check  - Lipid panel - CBC with Differential/Platelet - COMPLETE METABOLIC PANEL WITH GFR - Hemoglobin A1c - HIV Antibody (routine testing w rflx) - RPR - TSH - Cytology (oral, anal, urethral) ancillary only  2. Lipid screening  - Lipid panel  3. Routine screening for STI (sexually transmitted infection)  -  HIV Antibody (routine testing w rflx) - RPR - Cytology (oral, anal, urethral) ancillary only  4. Pre-diabetes  - Hemoglobin A1c  5. Weight gain  - TSH  6. Needs flu shot  refused   -Prostate cancer screening and PSA options (with potential risks and benefits of testing vs not testing) were discussed along with  recent recs/guidelines. -USPSTF grade A and B recommendations reviewed with patient; age-appropriate recommendations, preventive care, screening tests, etc discussed and encouraged; healthy living encouraged; see AVS for patient education given to patient -Discussed importance of 150 minutes of physical activity weekly, eat two servings of fish weekly, eat one serving of tree nuts ( cashews, pistachios, pecans, almonds.Marland Kitchen) every other day, eat 6 servings of fruit/vegetables daily and drink plenty of water and avoid sweet beverages.

## 2021-01-22 ENCOUNTER — Encounter: Payer: Self-pay | Admitting: Family Medicine

## 2021-01-22 ENCOUNTER — Other Ambulatory Visit: Payer: Self-pay

## 2021-01-22 ENCOUNTER — Ambulatory Visit (INDEPENDENT_AMBULATORY_CARE_PROVIDER_SITE_OTHER): Payer: 59 | Admitting: Family Medicine

## 2021-01-22 ENCOUNTER — Other Ambulatory Visit (HOSPITAL_COMMUNITY)
Admission: RE | Admit: 2021-01-22 | Discharge: 2021-01-22 | Disposition: A | Discharge status: Home / Self Care | Payer: 59 | Source: Ambulatory Visit | Attending: Family Medicine | Admitting: Family Medicine

## 2021-01-22 VITALS — BP 122/72 | HR 78 | Temp 98.1°F | Resp 16 | Ht 72.0 in | Wt 252.0 lb

## 2021-01-22 DIAGNOSIS — Z0000 Encounter for general adult medical examination without abnormal findings: Secondary | ICD-10-CM | POA: Diagnosis present

## 2021-01-22 DIAGNOSIS — Z113 Encounter for screening for infections with a predominantly sexual mode of transmission: Secondary | ICD-10-CM | POA: Diagnosis not present

## 2021-01-22 DIAGNOSIS — R7303 Prediabetes: Secondary | ICD-10-CM | POA: Diagnosis not present

## 2021-01-22 DIAGNOSIS — Z23 Encounter for immunization: Secondary | ICD-10-CM

## 2021-01-22 DIAGNOSIS — Z13220 Encounter for screening for lipoid disorders: Secondary | ICD-10-CM | POA: Diagnosis not present

## 2021-01-22 DIAGNOSIS — R635 Abnormal weight gain: Secondary | ICD-10-CM

## 2021-01-22 NOTE — Patient Instructions (Signed)
Preventive Care 21-35 Years Old, Male Preventive care refers to lifestyle choices and visits with your health care provider that can promote health and wellness. This includes:  A yearly physical exam. This is also called an annual wellness visit.  Regular dental and eye exams.  Immunizations.  Screening for certain conditions.  Healthy lifestyle choices, such as: ? Eating a healthy diet. ? Getting regular exercise. ? Not using drugs or products that contain nicotine and tobacco. ? Limiting alcohol use. What can I expect for my preventive care visit? Physical exam Your health care provider may check your:  Height and weight. These may be used to calculate your BMI (body mass index). BMI is a measurement that tells if you are at a healthy weight.  Heart rate and blood pressure.  Body temperature.  Skin for abnormal spots. Counseling Your health care provider may ask you questions about your:  Past medical problems.  Family's medical history.  Alcohol, tobacco, and drug use.  Emotional well-being.  Home life and relationship well-being.  Sexual activity.  Diet, exercise, and sleep habits.  Work and work environment.  Access to firearms. What immunizations do I need? Vaccines are usually given at various ages, according to a schedule. Your health care provider will recommend vaccines for you based on your age, medical history, and lifestyle or other factors, such as travel or where you work.   What tests do I need? Blood tests  Lipid and cholesterol levels. These may be checked every 5 years starting at age 20.  Hepatitis C test.  Hepatitis B test. Screening  Diabetes screening. This is done by checking your blood sugar (glucose) after you have not eaten for a while (fasting).  Genital exam to check for testicular cancer or hernias.  STD (sexually transmitted disease) testing, if you are at risk. Talk with your health care provider about your test results,  treatment options, and if necessary, the need for more tests.   Follow these instructions at home: Eating and drinking  Eat a healthy diet that includes fresh fruits and vegetables, whole grains, lean protein, and low-fat dairy products.  Drink enough fluid to keep your urine pale yellow.  Take vitamin and mineral supplements as recommended by your health care provider.  Do not drink alcohol if your health care provider tells you not to drink.  If you drink alcohol: ? Limit how much you have to 0-2 drinks a day. ? Be aware of how much alcohol is in your drink. In the [place] one drink equals one 12 oz bottle of beer (355 mL), one 5 oz glass of wine (148 mL), or one 1 oz glass of hard liquor (44 mL).   Lifestyle  Take daily care of your teeth and gums. Brush your teeth every morning and night with fluoride toothpaste. Floss one time each day.  Stay active. Exercise for at least 30 minutes 5 or more days each week.  Do not use any products that contain nicotine or tobacco, such as cigarettes, e-cigarettes, and chewing tobacco. If you need help quitting, ask your health care provider.  Do not use drugs.  If you are sexually active, practice safe sex. Use a condom or other form of protection to prevent STIs (sexually transmitted infections).  Find healthy ways to cope with stress, such as: ? Meditation, yoga, or listening to music. ? Journaling. ? Talking to a trusted person. ? Spending time with friends and family. Safety  Always wear your seat belt while driving   or riding in a vehicle.  Do not drive: ? If you have been drinking alcohol. Do not ride with someone who has been drinking. ? When you are tired or distracted. ? While texting.  Wear a helmet and other protective equipment during sports activities.  If you have firearms in your house, make sure you follow all gun safety procedures.  Seek help if you have been physically or sexually abused. What's next?  Go to your  health care provider once a year for an annual wellness visit.  Ask your health care provider how often you should have your eyes and teeth checked.  Stay up to date on all vaccines. This information is not intended to replace advice given to you by your health care provider. Make sure you discuss any questions you have with your health care provider. Document Revised: 08/01/2019 Document Reviewed: 11/09/2018 Elsevier Patient Education  2021 Elsevier Inc.  

## 2021-01-23 LAB — COMPLETE METABOLIC PANEL WITH GFR
AG Ratio: 1.5 (calc) (ref 1.0–2.5)
ALT: 39 U/L (ref 9–46)
AST: 27 U/L (ref 10–40)
Albumin: 4.4 g/dL (ref 3.6–5.1)
Alkaline phosphatase (APISO): 54 U/L (ref 36–130)
BUN: 11 mg/dL (ref 7–25)
CO2: 31 mmol/L (ref 20–32)
Calcium: 9.4 mg/dL (ref 8.6–10.3)
Chloride: 103 mmol/L (ref 98–110)
Creat: 0.97 mg/dL (ref 0.60–1.35)
GFR, Est African American: 118 mL/min/{1.73_m2} (ref >=60)
GFR, Est Non African American: 101 mL/min/{1.73_m2} (ref >=60)
Globulin: 2.9 g/dL (calc) (ref 1.9–3.7)
Glucose, Bld: 90 mg/dL (ref 65–99)
Potassium: 4.1 mmol/L (ref 3.5–5.3)
Sodium: 141 mmol/L (ref 135–146)
Total Bilirubin: 0.5 mg/dL (ref 0.2–1.2)
Total Protein: 7.3 g/dL (ref 6.1–8.1)

## 2021-01-23 LAB — CBC WITH DIFFERENTIAL/PLATELET
Absolute Monocytes: 464 cells/uL (ref 200–950)
Basophils Absolute: 59 cells/uL (ref 0–200)
Basophils Relative: 1.1 %
Eosinophils Absolute: 302 cells/uL (ref 15–500)
Eosinophils Relative: 5.6 %
HCT: 43.4 % (ref 38.5–50.0)
Hemoglobin: 14.4 g/dL (ref 13.2–17.1)
Lymphs Abs: 2133 cells/uL (ref 850–3900)
MCH: 28.6 pg (ref 27.0–33.0)
MCHC: 33.2 g/dL (ref 32.0–36.0)
MCV: 86.1 fL (ref 80.0–100.0)
MPV: 10.1 fL (ref 7.5–12.5)
Monocytes Relative: 8.6 %
Neutro Abs: 2441 cells/uL (ref 1500–7800)
Neutrophils Relative %: 45.2 %
Platelets: 250 10*3/uL (ref 140–400)
RBC: 5.04 10*6/uL (ref 4.20–5.80)
RDW: 13.1 % (ref 11.0–15.0)
Total Lymphocyte: 39.5 %
WBC: 5.4 10*3/uL (ref 3.8–10.8)

## 2021-01-23 LAB — LIPID PANEL
Cholesterol: 166 mg/dL (ref <200)
HDL: 48 mg/dL (ref >=40)
LDL Cholesterol (Calc): 106 mg/dL (calc) — ABNORMAL HIGH
Non-HDL Cholesterol (Calc): 118 mg/dL (calc) (ref <130)
Total CHOL/HDL Ratio: 3.5 (calc) (ref <5.0)
Triglycerides: 41 mg/dL (ref <150)

## 2021-01-23 LAB — CYTOLOGY, (ORAL, ANAL, URETHRAL) ANCILLARY ONLY
Chlamydia: NEGATIVE
Comment: NEGATIVE
Comment: NORMAL
Neisseria Gonorrhea: NEGATIVE

## 2021-01-23 LAB — HEMOGLOBIN A1C
Hgb A1c MFr Bld: 5.7 % of total Hgb — ABNORMAL HIGH (ref <5.7)
Mean Plasma Glucose: 117 mg/dL
eAG (mmol/L): 6.5 mmol/L

## 2021-01-23 LAB — HIV ANTIBODY (ROUTINE TESTING W REFLEX): HIV 1&2 Ab, 4th Generation: NONREACTIVE

## 2021-01-23 LAB — TSH: TSH: 1 mIU/L (ref 0.40–4.50)

## 2021-01-23 LAB — RPR: RPR Ser Ql: NONREACTIVE

## 2021-08-24 ENCOUNTER — Telehealth: Payer: Self-pay

## 2021-08-24 ENCOUNTER — Ambulatory Visit: Payer: Self-pay | Admitting: *Deleted

## 2021-08-24 NOTE — Telephone Encounter (Signed)
Returned pt call. Pt has had a sore throat for 2 weeks. It  hurts when he swallow. May be some swelling in lymph nodes. Pt has not looked at his tonsils to check for pus.   Made appointment with Dr. Carlynn Purl for the 29th and reviewed care advice.   Pt will seek immediate medical care if he experiences difficulty breathing or high fever.   Pt agrees with plan.        Reason for Disposition  [1] Sore throat is the only symptom AND [2] present > 48 hours  Answer Assessment - Initial Assessment Questions 1. ONSET: "When did the throat start hurting?" (Hours or days ago)      2 weeks ago 2. SEVERITY: "How bad is the sore throat?" (Scale 1-10; mild, moderate or severe)   - MILD (1-3):  doesn't interfere with eating or normal activities   - MODERATE (4-7): interferes with eating some solids and normal activities   - SEVERE (8-10):  excruciating pain, interferes with most normal activities   - SEVERE DYSPHAGIA: can't swallow liquids, drooling     5 3. STREP EXPOSURE: "Has there been any exposure to strep within the past week?" If Yes, ask: "What type of contact occurred?"      no 4.  VIRAL SYMPTOMS: "Are there any symptoms of a cold, such as a runny nose, cough, hoarse voice or red eyes?"      no 5. FEVER: "Do you have a fever?" If Yes, ask: "What is your temperature, how was it measured, and when did it start?"     no 6. PUS ON THE TONSILS: "Is there pus on the tonsils in the back of your throat?"     Has not looked 7. OTHER SYMPTOMS: "Do you have any other symptoms?" (e.g., difficulty breathing, headache, rash)     no 8. PREGNANCY: "Is there any chance you are pregnant?" "When was your last menstrual period?"     na  Protocols used: Sore Throat-A-AH

## 2021-08-24 NOTE — Telephone Encounter (Signed)
Summary: sore throat   The patient would like to be contacted by a member of staff about a sore throat they've been experiencing for roughly two weeks   The patient shares that they've previously been directed to see an ear nose and throat specialist for their tonsil concerns   Please contact further when available     Attempted to call patient- left message to call office on VM.

## 2021-08-24 NOTE — Telephone Encounter (Signed)
Lvm to schedule an appt for referral    Copied from CRM [id number]. Topic: Referral - Request for Referral >> Aug 24, 2021  8:33 AM Gaetana Michaelis A wrote: Has patient seen PCP for this complaint? Yes.    *If NO, is insurance requiring patient see PCP for this issue before PCP can refer them?  Referral for which specialty: Otolaryngology   Preferred provider/office: Patient has no preference  Reason for referral: Patient is having pain when swallowing and has had a history of tonsil concerns

## 2021-08-26 NOTE — Progress Notes (Signed)
Name: Bryan Horton   MRN: [id number]    DOB: 12/28/1985   Date:08/27/2021       Progress Note  Subjective  Chief Complaint  Throat  HPI  Sore throat: Symptoms started a couple of weeks with nasal congestion, ear fullness, followed by chest congestion and mild cough, no drainage, no fever, chills or change in appetite. He states he got concerned because his throat was very painful a few days ago, felt swollen and worse when eating, but now is usually worse at night , he is feeling much better now  He states he also got stung by multiple yellow jackets  before the URI symptoms, he took Benadryl , he had some areas of swelling, felt puffy.    Patient Active Problem List   Diagnosis Date Noted   Shift work sleep disorder 01/01/2016   Tinea versicolor 01/01/2016   Pain in shoulder 01/01/2016   Obesity (BMI 30.0-34.9) 01/01/2016    Past Surgical History:  Procedure Laterality Date   CIRCUMCISION     as an infant   HERNIA REPAIR  05/13/2009   Umbilical    Family History  Problem Relation Age of Onset   Hypertension Mother    Hypertension Father    Diabetes Father    Other Paternal Uncle     Social History   Tobacco Use   Smoking status: Former    Types: Cigars   Smokeless tobacco: Never   Tobacco comments:    he still smokes occasionally   Substance Use Topics   Alcohol use: Yes    Alcohol/week: 0.0 standard drinks    Comment: seldom    No current outpatient medications on file.  No Known Allergies  I personally reviewed active problem list, medication list, allergies, family history, social history, health maintenance with the patient/caregiver today.   ROS  Constitutional: Negative for fever or weight change.  Respiratory: Negative for cough and shortness of breath.   Cardiovascular: Negative for chest pain or palpitations.  Gastrointestinal: Negative for abdominal pain, no bowel changes.  Musculoskeletal: Negative for gait problem or joint swelling.   Skin: Negative for rash.  Neurological: Negative for dizziness or headache.  No other specific complaints in a complete review of systems (except as listed in HPI above).   Objective  Vitals:   08/27/21 0804  BP: 120/74  Pulse: 88  Resp: 16  Temp: 97.7 F (36.5 C)  SpO2: 98%  Weight: 256 lb (116.1 kg)  Height: 6' (1.829 m)    Body mass index is 34.72 kg/m.  Physical Exam  Constitutional: Patient appears well-developed and well-nourished. Obese  No distress.  HEENT: head atraumatic, normocephalic, pupils equal and reactive to light, ears normal TM, neck supple, throat : enlarged tonsils, clean post-nasal drainage, boggy turbinates  Cardiovascular: Normal rate, regular rhythm and normal heart sounds.  No murmur heard. No BLE edema. Pulmonary/Chest: Effort normal and breath sounds normal. No respiratory distress. Abdominal: Soft.  There is no tenderness. Psychiatric: Patient has a normal mood and affect. behavior is normal. Judgment and thought content normal.    PHQ2/9: Depression screen [facility] 2/9 08/27/2021 01/22/2021 10/12/2019 10/10/2018 06/21/2018  Decreased Interest 0 0 0 0 0  Down, Depressed, Hopeless 0 0 0 0 0  PHQ - 2 Score 0 0 0 0 0  Altered sleeping - 0 2 0 0  Tired, decreased energy - 0 0 0 0  Change in appetite - 3 2 3  0  Feeling bad or failure about yourself  -  0 0 0 0  Trouble concentrating - 0 0 0 0  Moving slowly or fidgety/restless - 0 0 0 0  Suicidal thoughts - 0 0 0 0  PHQ-9 Score - 3 4 3  0  Difficult doing work/chores - - Not difficult at all Not difficult at all Not difficult at all    phq 9 is negative   Fall Risk: Fall Risk  08/27/2021 01/22/2021 10/12/2019 10/10/2018 06/21/2018  Falls in the past year? 0 0 0 1 No  Number falls in past yr: 0 0 0 0 -  Injury with Fall? 0 0 0 1 -  Risk for fall due to : No Fall Risks - - - -  Follow up Falls prevention discussed - - - -      Functional Status Survey: Is the patient deaf or have difficulty  hearing?: No Does the patient have difficulty seeing, even when wearing glasses/contacts?: No Does the patient have difficulty concentrating, remembering, or making decisions?: No Does the patient have difficulty walking or climbing stairs?: No Does the patient have difficulty dressing or bathing?: No Does the patient have difficulty doing errands alone such as visiting a doctor's office or shopping?: No    Assessment & Plan 1. Sore throat  Reassurance given, he is getting better clinically, call back if needed.  - loratadine (CLARITIN) 10 MG tablet; Take 1 tablet (10 mg total) by mouth daily.  Dispense: 30 tablet; Refill: 2 - fluticasone (FLONASE) 50 MCG/ACT nasal spray; Place 2 sprays into both nostrils daily.  Dispense: 16 g; Refill: 2  2. Nasal congestion  - loratadine (CLARITIN) 10 MG tablet; Take 1 tablet (10 mg total) by mouth daily.  Dispense: 30 tablet; Refill: 2 - fluticasone (FLONASE) 50 MCG/ACT nasal spray; Place 2 sprays into both nostrils daily.  Dispense: 16 g; Refill: 2  3. Enlarged tonsils

## 2021-08-27 ENCOUNTER — Encounter: Payer: Self-pay | Admitting: Family Medicine

## 2021-08-27 ENCOUNTER — Other Ambulatory Visit: Payer: Self-pay

## 2021-08-27 ENCOUNTER — Ambulatory Visit: Payer: 59 | Admitting: Family Medicine

## 2021-08-27 VITALS — BP 120/74 | HR 88 | Temp 97.7°F | Resp 16 | Ht 72.0 in | Wt 256.0 lb

## 2021-08-27 DIAGNOSIS — J029 Acute pharyngitis, unspecified: Secondary | ICD-10-CM | POA: Diagnosis not present

## 2021-08-27 DIAGNOSIS — J351 Hypertrophy of tonsils: Secondary | ICD-10-CM | POA: Diagnosis not present

## 2021-08-27 DIAGNOSIS — R0981 Nasal congestion: Secondary | ICD-10-CM

## 2021-08-27 MED ORDER — FLUTICASONE PROPIONATE 50 MCG/ACT NA SUSP
2.0000 | Freq: Every day | NASAL | 2 refills | Status: DC
Start: 2021-08-27 — End: 2024-06-26

## 2021-08-27 MED ORDER — LORATADINE 10 MG PO TABS: 10.0000 mg | ORAL_TABLET | Freq: Every day | ORAL | 2 refills | Status: DC

## 2021-10-08 ENCOUNTER — Other Ambulatory Visit: Payer: Self-pay | Admitting: Family Medicine

## 2021-10-08 ENCOUNTER — Telehealth: Payer: Self-pay

## 2021-10-08 NOTE — Telephone Encounter (Signed)
Copied from CRM [id number]. Topic: General - Other >> Oct 08, 2021 10:27 AM Jaquita Rector A wrote: Reason for CRM: Patient called in to inform Dr Carlynn Purl that he feel like the same issues that he was seen for on the last visit is coming back again having symptoms 2 days say that nasal passage feel swollen also. still have the nasal spray and Claritin and when he use the nasal spray it feel a little bit better. Patient want a call back at  Ph# [phone]

## 2022-01-25 ENCOUNTER — Encounter: Payer: 59 | Admitting: Family Medicine

## 2022-02-10 ENCOUNTER — Other Ambulatory Visit (HOSPITAL_COMMUNITY)
Admission: RE | Admit: 2022-02-10 | Discharge: 2022-02-10 | Disposition: A | Discharge status: Home / Self Care | Payer: 59 | Source: Ambulatory Visit | Attending: Family Medicine | Admitting: Family Medicine

## 2022-02-10 ENCOUNTER — Encounter: Payer: Self-pay | Admitting: Internal Medicine

## 2022-02-10 ENCOUNTER — Other Ambulatory Visit: Payer: Self-pay

## 2022-02-10 ENCOUNTER — Ambulatory Visit (INDEPENDENT_AMBULATORY_CARE_PROVIDER_SITE_OTHER): Payer: 59 | Admitting: Internal Medicine

## 2022-02-10 VITALS — BP 126/78 | HR 78 | Temp 98.5°F | Resp 16 | Ht 72.0 in | Wt 258.3 lb

## 2022-02-10 DIAGNOSIS — Z0001 Encounter for general adult medical examination with abnormal findings: Secondary | ICD-10-CM | POA: Diagnosis not present

## 2022-02-10 DIAGNOSIS — Z113 Encounter for screening for infections with a predominantly sexual mode of transmission: Secondary | ICD-10-CM

## 2022-02-10 DIAGNOSIS — R7303 Prediabetes: Secondary | ICD-10-CM | POA: Diagnosis not present

## 2022-02-10 DIAGNOSIS — Z13220 Encounter for screening for lipoid disorders: Secondary | ICD-10-CM

## 2022-02-10 NOTE — Patient Instructions (Addendum)
It was great seeing you today! ? ?Plan discussed at today's visit: ?-Blood work ordered today, results will be uploaded to MyChart.  ? ?Follow up in: 1 year ? ?Take care and let us know if you have any questions or concerns prior to your next visit. ? ?Dr. Caralee Ates ? ?Health Maintenance, Male ?Adopting a healthy lifestyle and getting preventive care are important in promoting health and wellness. Ask your health care provider about: ?The right schedule for you to have regular tests and exams. ?Things you can do on your own to prevent diseases and keep yourself healthy. ?What should I know about diet, weight, and exercise? ?Eat a healthy diet ? ?Eat a diet that includes plenty of vegetables, fruits, low-fat dairy products, and lean protein. ?Do not eat a lot of foods that are high in solid fats, added sugars, or sodium. ?Maintain a healthy weight ?Body mass index (BMI) is a measurement that can be used to identify possible weight problems. It estimates body fat based on height and weight. Your health care provider can help determine your BMI and help you achieve or maintain a healthy weight. ?Get regular exercise ?Get regular exercise. This is one of the most important things you can do for your health. Most adults should: ?Exercise for at least 150 minutes each week. The exercise should increase your heart rate and make you sweat (moderate-intensity exercise). ?Do strengthening exercises at least twice a week. This is in addition to the moderate-intensity exercise. ?Spend less time sitting. Even light physical activity can be beneficial. ?Watch cholesterol and blood lipids ?Have your blood tested for lipids and cholesterol at 36 years of age, then have this test every 5 years. ?You may need to have your cholesterol levels checked more often if: ?Your lipid or cholesterol levels are high. ?You are older than 36 years of age. ?You are at high risk for heart disease. ?What should I know about cancer screening? ?Many  types of cancers can be detected early and may often be prevented. Depending on your health history and family history, you may need to have cancer screening at various ages. This may include screening for: ?Colorectal cancer. ?Prostate cancer. ?Skin cancer. ?Lung cancer. ?What should I know about heart disease, diabetes, and high blood pressure? ?Blood pressure and heart disease ?High blood pressure causes heart disease and increases the risk of stroke. This is more likely to develop in people who have high blood pressure readings or are overweight. ?Talk with your health care provider about your target blood pressure readings. ?Have your blood pressure checked: ?Every 3-5 years if you are 56-36 years of age. ?Every year if you are 75 years old or older. ?If you are between the ages of 38 and 76 and are a current or former smoker, ask your health care provider if you should have a one-time screening for abdominal aortic aneurysm (AAA). ?Diabetes ?Have regular diabetes screenings. This checks your fasting blood sugar level. Have the screening done: ?Once every three years after age 49 if you are at a normal weight and have a low risk for diabetes. ?More often and at a younger age if you are overweight or have a high risk for diabetes. ?What should I know about preventing infection? ?Hepatitis B ?If you have a higher risk for hepatitis B, you should be screened for this virus. Talk with your health care provider to find out if you are at risk for hepatitis B infection. ?Hepatitis C ?Blood testing is recommended for: ?Everyone  born from [birth year] through 1965. ?Anyone with known risk factors for hepatitis C. ?Sexually transmitted infections (STIs) ?You should be screened each year for STIs, including gonorrhea and chlamydia, if: ?You are sexually active and are younger than 36 years of age. ?You are older than 36 years of age and your health care provider tells you that you are at risk for this type of infection. ?Your  sexual activity has changed since you were last screened, and you are at increased risk for chlamydia or gonorrhea. Ask your health care provider if you are at risk. ?Ask your health care provider about whether you are at high risk for HIV. Your health care provider may recommend a prescription medicine to help prevent HIV infection. If you choose to take medicine to prevent HIV, you should first get tested for HIV. You should then be tested every 3 months for as long as you are taking the medicine. ?Follow these instructions at home: ?Alcohol use ?Do not drink alcohol if your health care provider tells you not to drink. ?If you drink alcohol: ?Limit how much you have to 0-2 drinks a day. ?Know how much alcohol is in your drink. In the [place] one drink equals one 12 oz bottle of beer (355 mL), one 5 oz glass of wine (148 mL), or one 1? oz glass of hard liquor (44 mL). ?Lifestyle ?Do not use any products that contain nicotine or tobacco. These products include cigarettes, chewing tobacco, and vaping devices, such as e-cigarettes. If you need help quitting, ask your health care provider. ?Do not use street drugs. ?Do not share needles. ?Ask your health care provider for help if you need support or information about quitting drugs. ?General instructions ?Schedule regular health, dental, and eye exams. ?Stay current with your vaccines. ?Tell your health care provider if: ?You often feel depressed. ?You have ever been abused or do not feel safe at home. ?Summary ?Adopting a healthy lifestyle and getting preventive care are important in promoting health and wellness. ?Follow your health care provider's instructions about healthy diet, exercising, and getting tested or screened for diseases. ?Follow your health care provider's instructions on monitoring your cholesterol and blood pressure. ?This information is not intended to replace advice given to you by your health care provider. Make sure you discuss any questions you  have with your health care provider. ?Document Revised: 04/06/2021 Document Reviewed: 04/06/2021 ?Elsevier Patient Education ? 2022 [employer] Inc[employer] ? ?   Why follow it? Research shows? ?Those who follow the Mediterranean diet have a reduced risk of heart disease  ?The diet is associated with a reduced incidence of Parkinson's and Alzheimer's diseases ?People following the diet may have longer life expectancies and lower rates of chronic diseases  ?The Dietary Guidelines for Americans recommends the Mediterranean diet as an eating plan to promote health and prevent disease ? ?What Is the Mediterranean Diet?  ?Healthy eating plan based on typical foods and recipes of Mediterranean-style cooking ?The diet is primarily a plant based diet; these foods should make up a majority of meals  ? ?Starches - Plant based foods should make up a majority of meals ?- They are an important sources of vitamins, minerals, energy, antioxidants, and fiber ?- Choose whole grains, foods high in fiber and minimally processed items  ?- Typical grain sources include wheat, oats, barley, corn, brown rice, bulgar, farro, millet, polenta, couscous  ?- Various types of beans include chickpeas, lentils, fava beans, black beans, white beans   ?Fruits  Veggies -  Large quantities of antioxidant rich fruits & veggies; 6 or more servings  ?- Vegetables can be eaten raw or lightly drizzled with oil and cooked  ?- Vegetables common to the traditional Mediterranean Diet include: artichokes, arugula, beets, broccoli, brussel sprouts, cabbage, carrots, celery, collard greens, cucumbers, eggplant, kale, leeks, lemons, lettuce, mushrooms, okra, onions, peas, peppers, potatoes, pumpkin, radishes, rutabaga, shallots, spinach, sweet potatoes, turnips, zucchini ?- Fruits common to the Mediterranean Diet include: apples, apricots, avocados, cherries, clementines, dates, figs, grapefruits, grapes, melons, nectarines, oranges, peaches, pears, pomegranates,  strawberries, tangerines  ?Fats - Replace butter and margarine with healthy oils, such as olive oil, canola oil, and tahini  ?- Limit nuts to no more than a handful a day  ?- Nuts include walnuts, almonds, pecans, pist

## 2022-02-10 NOTE — Progress Notes (Signed)
Name: Bryan Horton   MRN: [id number]    DOB: 08-Jul-1986   Date:02/10/2022       Progress Note  Subjective  Chief Complaint  Chief Complaint  Patient presents with   Annual Exam    HPI  Patient presents for annual CPE.  Diet: doesn't feel like diet is well balanced, eats out some, likes chicken wings, burgers and steak. Doesn't cook at home much, doesn't have time Exercise: no routine, had been going to the gym but expensive and doesn't have time due to work. Works in [employer]. Likes to hunt, take care of animals and gardens.   Depression: phq 9 is negative Depression screen [facility] 2/9 02/10/2022 08/27/2021 01/22/2021 10/12/2019 10/10/2018  Decreased Interest 0 0 0 0 0  Down, Depressed, Hopeless 0 0 0 0 0  PHQ - 2 Score 0 0 0 0 0  Altered sleeping 0 - 0 2 0  Tired, decreased energy 0 - 0 0 0  Change in appetite 0 - 3 2 3   Feeling bad or failure about yourself  0 - 0 0 0  Trouble concentrating 0 - 0 0 0  Moving slowly or fidgety/restless 0 - 0 0 0  Suicidal thoughts 0 - 0 0 0  PHQ-9 Score 0 - 3 4 3   Difficult doing work/chores Not difficult at all - - Not difficult at all Not difficult at all  Some recent data might be hidden    Hypertension:  BP Readings from Last 3 Encounters:  02/10/22 126/78  08/27/21 120/74  01/22/21 122/72    Obesity: Wt Readings from Last 3 Encounters:  02/10/22 258 lb 4.8 oz (117.2 kg)  08/27/21 256 lb (116.1 kg)  01/22/21 252 lb (114.3 kg)   BMI Readings from Last 3 Encounters:  02/10/22 35.03 kg/m  08/27/21 34.72 kg/m  01/22/21 34.18 kg/m     Lipids:  Lab Results  Component Value Date   CHOL 166 01/22/2021   CHOL 161 10/10/2018   CHOL 159 10/04/2017   Lab Results  Component Value Date   HDL 48 01/22/2021   HDL 48 10/10/2018   HDL 58 10/04/2017   Lab Results  Component Value Date   LDLCALC 106 (H) 01/22/2021   LDLCALC 99 10/10/2018   LDLCALC 87 10/04/2017   Lab Results  Component Value Date   TRIG 41 01/22/2021    TRIG 57 10/10/2018   TRIG 46 10/04/2017   Lab Results  Component Value Date   CHOLHDL 3.5 01/22/2021   CHOLHDL 3.4 10/10/2018   CHOLHDL 2.7 10/04/2017   No results found for: LDLDIRECT Glucose:  Glucose, Bld  Date Value Ref Range Status  01/22/2021 90 65 - 99 mg/dL Final    Comment:    .            Fasting reference interval .   10/10/2018 84 65 - 99 mg/dL Final    Comment:    .            Fasting reference interval .   10/04/2017 90 65 - 99 mg/dL Final    Comment:    .            Fasting reference interval .     Flowsheet Row Office Visit from 01/22/2021 in [facility]  AUDIT-C Score 1       Significant Other STD testing and prevention (HIV/chl/gon/syphilis):   Hep C Screening: 2019 Skin cancer: Discussed monitoring for atypical lesions Colorectal cancer: n/a, no family history  of colon cancer  Prostate cancer:  not applicable No results found for: PSA   Lung cancer:  Low Dose CT Chest recommended if Age 49-80 years, 30 pack-year currently smoking OR have quit w/in 15years. Patient  not applicable AAA: The USPSTF recommends one-time screening with ultrasonography in men ages 43 to 75 years who have ever smoked. Patient:  not applicable  Vaccines:   HPV: n/a Tdap: 2018 Shingrix: n/a Pneumonia: n/a Flu: politely declines  COVID-19: UTD  Advanced Care Planning: A voluntary discussion about advance care planning including the explanation and discussion of advance directives.  Discussed health care proxy and Living will, and the patient was able to identify a health care proxy, parents.  Patient does not have a living will.   Patient Active Problem List   Diagnosis Date Noted   Shift work sleep disorder 01/01/2016   Tinea versicolor 01/01/2016   Pain in shoulder 01/01/2016   Obesity (BMI 30.0-34.9) 01/01/2016    Past Surgical History:  Procedure Laterality Date   CIRCUMCISION     as an infant   HERNIA REPAIR  05/13/2009    Umbilical    Family History  Problem Relation Age of Onset   Hypertension Mother    Hypertension Father    Diabetes Father    Other Paternal Uncle     Social History   Socioeconomic History   Marital status: Significant Other    Spouse name: Carollee Herter    Number of children: 1   Years of education: Not on file   Highest education level: High school graduate  Occupational History   Occupation: utilities maintenance     Employer: [employer]  Tobacco Use   Smoking status: Former    Types: Cigars   Smokeless tobacco: Never   Tobacco comments:    he still smokes occasionally   Vaping Use   Vaping Use: Never used  Substance and Sexual Activity   Alcohol use: Yes    Alcohol/week: 0.0 standard drinks    Comment: seldom   Drug use: No   Sexual activity: Yes    Partners: Female    Birth control/protection: None  Other Topics Concern   Not on file  Social History Narrative   Lives with significant for the past 14 years, they have a 39 yo son   He works 12 hours shifts, it can be day or night.   Bought their house 10/07/2018    Social Determinants of Health   Financial Resource Strain: Not on file  Food Insecurity: Not on file  Transportation Needs: Not on file  Physical Activity: Not on file  Stress: Not on file  Social Connections: Not on file  Intimate Partner Violence: Not on file     Current Outpatient Medications:    fluticasone (FLONASE) 50 MCG/ACT nasal spray, Place 2 sprays into both nostrils daily., Disp: 16 g, Rfl: 2   loratadine (CLARITIN) 10 MG tablet, Take 1 tablet (10 mg total) by mouth daily., Disp: 30 tablet, Rfl: 2  No Known Allergies   Review of Systems  Constitutional: Negative.   Respiratory: Negative.    Cardiovascular: Negative.   Gastrointestinal: Negative.   Genitourinary: Negative.     Objective  Vitals:   02/10/22 0819  BP: 126/78  Pulse: 78  Resp: 16  Temp: 98.5 F (36.9 C)  SpO2: 99%  Weight: 258 lb 4.8 oz (117.2  kg)  Height: 6' (1.829 m)    Body mass index is 35.03 kg/m.  Physical Exam  Constitutional:      Appearance: Normal appearance.  HENT:     Head: Normocephalic and atraumatic.     Nose: Nose normal.     Mouth/Throat:     Mouth: Mucous membranes are moist.     Pharynx: Oropharynx is clear.  Eyes:     Conjunctiva/sclera: Conjunctivae normal.  Cardiovascular:     Rate and Rhythm: Normal rate and regular rhythm.  Pulmonary:     Effort: Pulmonary effort is normal.     Breath sounds: Normal breath sounds.  Abdominal:     General: There is no distension.     Palpations: Abdomen is soft.     Tenderness: There is no abdominal tenderness.  Musculoskeletal:     Right lower leg: No edema.     Left lower leg: No edema.  Skin:    General: Skin is warm and dry.  Neurological:     General: No focal deficit present.     Mental Status: He is alert. Mental status is at baseline.  Psychiatric:        Mood and Affect: Mood normal.        Behavior: Behavior normal.     No results found for this or any previous visit (from the past 2160 hour(s)).   Fall Risk: Fall Risk  02/10/2022 08/27/2021 01/22/2021 10/12/2019 10/10/2018  Falls in the past year? 0 0 0 0 1  Number falls in past yr: 0 0 0 0 0  Injury with Fall? 0 0 0 0 1  Risk for fall due to : - No Fall Risks - - -  Follow up - Falls prevention discussed - - -    Assessment & Plan  1. Encounter for general adult medical examination with abnormal findings: Routine labs ordered today.   - CBC w/Diff/Platelet - COMPLETE METABOLIC PANEL WITH GFR  2. Pre-diabetes  - HgB A1c  3. Lipid screening  - Lipid Profile  4. Screening examination for STD (sexually transmitted disease)  - HIV antibody (with reflex) - RPR - Urine cytology ancillary only  -Prostate cancer screening and PSA options (with potential risks and benefits of testing vs not testing) were discussed along with recent recs/guidelines. -USPSTF grade A and B  recommendations reviewed with patient; age-appropriate recommendations, preventive care, screening tests, etc discussed and encouraged; healthy living encouraged; see AVS for patient education given to patient -Discussed importance of 150 minutes of physical activity weekly, eat two servings of fish weekly, eat one serving of tree nuts ( cashews, pistachios, pecans, almonds.Marland Kitchen) every other day, eat 6 servings of fruit/vegetables daily and drink plenty of water and avoid sweet beverages.  -Reviewed Health Maintenance: yes

## 2022-02-11 LAB — CBC WITH DIFFERENTIAL/PLATELET
Absolute Monocytes: 390 cells/uL (ref 200–950)
Basophils Absolute: 70 cells/uL (ref 0–200)
Basophils Relative: 1.4 %
Eosinophils Absolute: 220 cells/uL (ref 15–500)
Eosinophils Relative: 4.4 %
HCT: 43.6 % (ref 38.5–50.0)
Hemoglobin: 14.4 g/dL (ref 13.2–17.1)
Lymphs Abs: 2175 cells/uL (ref 850–3900)
MCH: 28.5 pg (ref 27.0–33.0)
MCHC: 33 g/dL (ref 32.0–36.0)
MCV: 86.2 fL (ref 80.0–100.0)
MPV: 10.6 fL (ref 7.5–12.5)
Monocytes Relative: 7.8 %
Neutro Abs: 2145 cells/uL (ref 1500–7800)
Neutrophils Relative %: 42.9 %
Platelets: 280 10*3/uL (ref 140–400)
RBC: 5.06 10*6/uL (ref 4.20–5.80)
RDW: 12.9 % (ref 11.0–15.0)
Total Lymphocyte: 43.5 %
WBC: 5 10*3/uL (ref 3.8–10.8)

## 2022-02-11 LAB — COMPLETE METABOLIC PANEL WITH GFR
AG Ratio: 1.6 (calc) (ref 1.0–2.5)
ALT: 25 U/L (ref 9–46)
AST: 22 U/L (ref 10–40)
Albumin: 4.5 g/dL (ref 3.6–5.1)
Alkaline phosphatase (APISO): 51 U/L (ref 36–130)
BUN: 15 mg/dL (ref 7–25)
CO2: 28 mmol/L (ref 20–32)
Calcium: 9.4 mg/dL (ref 8.6–10.3)
Chloride: 102 mmol/L (ref 98–110)
Creat: 0.99 mg/dL (ref 0.60–1.26)
Globulin: 2.9 g/dL (calc) (ref 1.9–3.7)
Glucose, Bld: 90 mg/dL (ref 65–99)
Potassium: 4.1 mmol/L (ref 3.5–5.3)
Sodium: 138 mmol/L (ref 135–146)
Total Bilirubin: 0.5 mg/dL (ref 0.2–1.2)
Total Protein: 7.4 g/dL (ref 6.1–8.1)
eGFR: 102 mL/min/{1.73_m2} (ref >=60)

## 2022-02-11 LAB — HEMOGLOBIN A1C
Hgb A1c MFr Bld: 5.7 % of total Hgb — ABNORMAL HIGH (ref <5.7)
Mean Plasma Glucose: 117 mg/dL
eAG (mmol/L): 6.5 mmol/L

## 2022-02-11 LAB — RPR: RPR Ser Ql: NONREACTIVE

## 2022-02-11 LAB — LIPID PANEL
Cholesterol: 163 mg/dL (ref <200)
HDL: 47 mg/dL (ref >=40)
LDL Cholesterol (Calc): 103 mg/dL (calc) — ABNORMAL HIGH
Non-HDL Cholesterol (Calc): 116 mg/dL (calc) (ref <130)
Total CHOL/HDL Ratio: 3.5 (calc) (ref <5.0)
Triglycerides: 45 mg/dL (ref <150)

## 2022-02-11 LAB — URINE CYTOLOGY ANCILLARY ONLY
Chlamydia: NEGATIVE
Comment: NEGATIVE
Comment: NORMAL
Neisseria Gonorrhea: NEGATIVE

## 2022-02-11 LAB — HIV ANTIBODY (ROUTINE TESTING W REFLEX): HIV 1&2 Ab, 4th Generation: NONREACTIVE

## 2023-02-20 NOTE — Progress Notes (Unsigned)
Name: Bryan Horton   MRN: [id number]    DOB: 1986/06/15   Date:02/21/2023       Progress Note  Subjective  Chief Complaint  Chief Complaint  Patient presents with   Annual Exam    HPI  Patient presents for annual CPE.  IPSS Questionnaire (AUA-7): Over the past month.   1)  How often have you had a sensation of not emptying your bladder completely after you finish urinating?  0 - Not at all  2)  How often have you had to urinate again less than two hours after you finished urinating? 0 - Not at all  3)  How often have you found you stopped and started again several times when you urinated?  2 - Less than half the time  4) How difficult have you found it to postpone urination?  0 - Not at all  5) How often have you had a weak urinary stream?  0 - Not at all  6) How often have you had to push or strain to begin urination?  0 - Not at all  7) How many times did you most typically get up to urinate from the time you went to bed until the time you got up in the morning?  1 - 1 time  Total score:  0-7 mildly symptomatic   8-19 moderately symptomatic   20-35 severely symptomatic     Diet: well rounded  Exercise: no Last Dental Exam: no, discussed importance of dental cleanings every 6 months Last Eye Exam: No but no corrective lens  Depression: phq 9 is negative    02/21/2023    8:33 AM 02/10/2022    8:25 AM 08/27/2021    8:04 AM 01/22/2021    9:08 AM 10/12/2019    9:27 AM  Depression screen PHQ 2/9  Decreased Interest 0 0 0 0 0  Down, Depressed, Hopeless 0 0 0 0 0  PHQ - 2 Score 0 0 0 0 0  Altered sleeping  0  0 2  Tired, decreased energy  0  0 0  Change in appetite  0  3 2  Feeling bad or failure about yourself   0  0 0  Trouble concentrating  0  0 0  Moving slowly or fidgety/restless  0  0 0  Suicidal thoughts  0  0 0  PHQ-9 Score  0  3 4  Difficult doing work/chores  Not difficult at all   Not difficult at all    Hypertension:  BP Readings from Last 3 Encounters:   02/10/22 126/78  08/27/21 120/74  01/22/21 122/72    Obesity: Wt Readings from Last 3 Encounters:  02/21/23 257 lb 9.6 oz (116.8 kg)  02/10/22 258 lb 4.8 oz (117.2 kg)  08/27/21 256 lb (116.1 kg)   BMI Readings from Last 3 Encounters:  02/21/23 34.94 kg/m  02/10/22 35.03 kg/m  08/27/21 34.72 kg/m     Lipids:  Lab Results  Component Value Date   CHOL 163 02/10/2022   CHOL 166 01/22/2021   CHOL 161 10/10/2018   Lab Results  Component Value Date   HDL 47 02/10/2022   HDL 48 01/22/2021   HDL 48 10/10/2018   Lab Results  Component Value Date   LDLCALC 103 (H) 02/10/2022   LDLCALC 106 (H) 01/22/2021   LDLCALC 99 10/10/2018   Lab Results  Component Value Date   TRIG 45 02/10/2022   TRIG 41 01/22/2021   TRIG 57 10/10/2018  Lab Results  Component Value Date   CHOLHDL 3.5 02/10/2022   CHOLHDL 3.5 01/22/2021   CHOLHDL 3.4 10/10/2018   No results found for: "LDLDIRECT" Glucose:  Glucose, Bld  Date Value Ref Range Status  02/10/2022 90 65 - 99 mg/dL Final    Comment:    .            Fasting reference interval .   01/22/2021 90 65 - 99 mg/dL Final    Comment:    .            Fasting reference interval .   10/10/2018 84 65 - 99 mg/dL Final    Comment:    .            Fasting reference interval .     [place] Office Visit from 02/21/2023 in [facility]  AUDIT-C Score 1       Significant Other STD testing and prevention (HIV/chl/gon/syphilis):  opting for screening today Hep C Screening: 2019 Skin cancer: Discussed monitoring for atypical lesions Colorectal cancer: Discussed, will start screening at age 29 Prostate cancer:  not applicable No results found for: "PSA"   Lung cancer:  Low Dose CT Chest recommended if Age 29-80 years, 30 pack-year currently smoking OR have quit w/in 15years. Patient  not applicable a candidate for screening   AAA: The USPSTF recommends one-time screening with ultrasonography in  men ages 30 to 58 years who have ever smoked. Patient   not applicable, a candidate for screening   Vaccines:  Tdap: UTD Shingrix: N/a Pneumonia:  N/a Flu: 2020 COVID-19: 2022  Advanced Care Planning: A voluntary discussion about advance care planning including the explanation and discussion of advance directives.  Discussed health care proxy and Living will, and the patient was able to identify a health care proxy as .  Patient does not have a living will and power of attorney of health care   Patient Active Problem List   Diagnosis Date Noted   Shift work sleep disorder 01/01/2016   Tinea versicolor 01/01/2016   Pain in shoulder 01/01/2016   Obesity (BMI 30.0-34.9) 01/01/2016    Past Surgical History:  Procedure Laterality Date   CIRCUMCISION     as an infant   HERNIA REPAIR  [id number]   Umbilical    Family History  Problem Relation Age of Onset   Hypertension Mother    Hypertension Father    Diabetes Father    Other Paternal Uncle     Social History   Socioeconomic History   Marital status: Significant Other    Spouse name: Larene Beach    Number of children: 1   Years of education: Not on file   Highest education level: High school graduate  Occupational History   Occupation: utilities maintenance     Employer: [employer]  Tobacco Use   Smoking status: Former    Types: Cigars   Smokeless tobacco: Never   Tobacco comments:    he still smokes occasionally   Vaping Use   Vaping Use: Never used  Substance and Sexual Activity   Alcohol use: Yes    Alcohol/week: 0.0 standard drinks of alcohol    Comment: seldom   Drug use: No   Sexual activity: Yes    Partners: Female    Birth control/protection: None  Other Topics Concern   Not on file  Social History Narrative   Lives with significant for the past 14 years, they have a 10  yo son   He works 12 hours shifts, it can be day or night.   Bought their house 10/07/2018    Social Determinants of Health    Financial Resource Strain: Low Risk  (02/21/2023)   Overall Financial Resource Strain (CARDIA)    Difficulty of Paying Living Expenses: Not hard at all  Food Insecurity: No Food Insecurity (02/21/2023)   Hunger Vital Sign    Worried About Running Out of Food in the Last Year: Never true    Ran Out of Food in the Last Year: Never true  Transportation Needs: No Transportation Needs (02/21/2023)   PRAPARE - Hydrologist (Medical): No    Lack of Transportation (Non-Medical): No  Physical Activity: Insufficiently Active (02/21/2023)   Exercise Vital Sign    Days of Exercise per Week: 3 days    Minutes of Exercise per Session: 30 min  Stress: Stress Concern Present (02/21/2023)   [place]    Feeling of Stress : To some extent  Social Connections: Moderately Integrated (02/21/2023)   Social Connection and Isolation Panel [NHANES]    Frequency of Communication with Friends and Family: More than three times a week    Frequency of Social Gatherings with Friends and Family: Once a week    Attends Religious Services: 1 to 4 times per year    Active Member of [employer] or Organizations: Yes    Attends Music therapist: More than 4 times per year    Marital Status: Never married  Intimate Partner Violence: Not At Risk (02/21/2023)   Humiliation, Afraid, Rape, and Kick questionnaire    Fear of Current or Ex-Partner: No    Emotionally Abused: No    Physically Abused: No    Sexually Abused: No     Current Outpatient Medications:    fluticasone (FLONASE) 50 MCG/ACT nasal spray, Place 2 sprays into both nostrils daily. (Patient not taking: Reported on 02/21/2023), Disp: 16 g, Rfl: 2   loratadine (CLARITIN) 10 MG tablet, Take 1 tablet (10 mg total) by mouth daily. (Patient not taking: Reported on 02/21/2023), Disp: 30 tablet, Rfl: 2  No Known Allergies   Review of Systems  All other systems  reviewed and are negative.    Objective  Vitals:   02/21/23 0832  BP: 138/82  Resp: 18  Temp: 98.1 F (36.7 C)  TempSrc: Oral  SpO2: 98%  Weight: 257 lb 9.6 oz (116.8 kg)  Height: 6' (1.829 m)    Body mass index is 34.94 kg/m.  Physical Exam Constitutional:      Appearance: Normal appearance.  HENT:     Head: Normocephalic and atraumatic.     Mouth/Throat:     Mouth: Mucous membranes are moist.     Pharynx: Oropharynx is clear.  Eyes:     Extraocular Movements: Extraocular movements intact.     Conjunctiva/sclera: Conjunctivae normal.     Pupils: Pupils are equal, round, and reactive to light.  Neck:     Comments: No thyromegaly  Cardiovascular:     Rate and Rhythm: Normal rate and regular rhythm.  Pulmonary:     Effort: Pulmonary effort is normal.     Breath sounds: Normal breath sounds.  Musculoskeletal:     Cervical back: No tenderness.     Right lower leg: No edema.     Left lower leg: No edema.  Lymphadenopathy:     Cervical: No cervical adenopathy.  Skin:  General: Skin is warm and dry.  Neurological:     General: No focal deficit present.     Mental Status: He is alert. Mental status is at baseline.  Psychiatric:        Mood and Affect: Mood normal.        Behavior: Behavior normal.      No results found for this or any previous visit (from the past 2160 hour(s)).   Fall Risk:    02/21/2023    8:33 AM 02/10/2022    8:25 AM 08/27/2021    8:04 AM 01/22/2021    9:07 AM 10/12/2019    9:27 AM  Fall Risk   Falls in the past year? 0 0 0 0 0  Number falls in past yr: 0 0 0 0 0  Injury with Fall? 0 0 0 0 0  Risk for fall due to :   No Fall Risks    Follow up   Falls prevention discussed       Functional Status Survey: Is the patient deaf or have difficulty hearing?: No Does the patient have difficulty seeing, even when wearing glasses/contacts?: No Does the patient have difficulty concentrating, remembering, or making decisions?: No Does  the patient have difficulty walking or climbing stairs?: No Does the patient have difficulty dressing or bathing?: No Does the patient have difficulty doing errands alone such as visiting a doctor's office or shopping?: No    Assessment & Plan  1. Annual physical exam: Labs due.   - CBC w/Diff/Platelet; Future - HgB A1c; Future - Lipid Profile; Future - Comprehensive Metabolic Panel (CMET); Future  2. Screening examination for STD (sexually transmitted disease): STD screening today.   - Urine cytology ancillary only - HIV antibody (with reflex); Future - RPR; Future   -Prostate cancer screening and PSA options (with potential risks and benefits of testing vs not testing) were discussed along with recent recs/guidelines. -USPSTF grade A and B recommendations reviewed with patient; age-appropriate recommendations, preventive care, screening tests, etc discussed and encouraged; healthy living encouraged; see AVS for patient education given to patient -Discussed importance of 150 minutes of physical activity weekly, eat two servings of fish weekly, eat one serving of tree nuts ( cashews, pistachios, pecans, almonds.Marland Kitchen) every other day, eat 6 servings of fruit/vegetables daily and drink plenty of water and avoid sweet beverages.  -Reviewed Health Maintenance: yes

## 2023-02-21 ENCOUNTER — Encounter: Payer: Self-pay | Admitting: Internal Medicine

## 2023-02-21 ENCOUNTER — Other Ambulatory Visit (HOSPITAL_COMMUNITY)
Admission: RE | Admit: 2023-02-21 | Discharge: 2023-02-21 | Disposition: A | Discharge status: Home / Self Care | Payer: 59 | Source: Ambulatory Visit | Attending: Internal Medicine | Admitting: Internal Medicine

## 2023-02-21 ENCOUNTER — Other Ambulatory Visit: Payer: Self-pay

## 2023-02-21 ENCOUNTER — Ambulatory Visit (INDEPENDENT_AMBULATORY_CARE_PROVIDER_SITE_OTHER): Payer: 59 | Admitting: Internal Medicine

## 2023-02-21 VITALS — BP 138/82 | Temp 98.1°F | Resp 18 | Ht 72.0 in | Wt 257.6 lb

## 2023-02-21 DIAGNOSIS — Z0000 Encounter for general adult medical examination without abnormal findings: Secondary | ICD-10-CM | POA: Insufficient documentation

## 2023-02-21 DIAGNOSIS — Z113 Encounter for screening for infections with a predominantly sexual mode of transmission: Secondary | ICD-10-CM | POA: Insufficient documentation

## 2023-02-22 LAB — URINE CYTOLOGY ANCILLARY ONLY
Chlamydia: NEGATIVE
Comment: NEGATIVE
Comment: NEGATIVE
Comment: NORMAL
Neisseria Gonorrhea: NEGATIVE
Trichomonas: NEGATIVE

## 2023-05-09 ENCOUNTER — Ambulatory Visit: Payer: Self-pay

## 2023-05-09 NOTE — Telephone Encounter (Signed)
Called and spoke to pt and he states he's feeling a little better and can wait to be seen on wednesday

## 2023-05-09 NOTE — Telephone Encounter (Signed)
Summary: personal/pt would like to speak with nurse   Pt was calling to make an appt with his PCP, first available is not until 06/24/23. Pt does not want to see another PCP. I did ask the pt what he was wanting to see his PCP for, it is personal. Pt would like to speak with a nurse regarding his issues. Please call pt back.     Chief Complaint: Painful urination - difficulty having a BM Symptoms: above Frequency: The past few days Pertinent Negatives: Patient denies fever, lower back pain inability to urinate or have a BM Disposition: [] ED /[] Urgent Care (no appt availability in office) / [x] Appointment(In office/virtual)/ []  [place] [facility]/ [] Home Care/ [] Refused Recommended Disposition /[] [place] [facility]/ []  Follow-up with PCP Additional Notes: Pt states that for the past couple of days he has had pain with both urination and having a BM. Pt is able to have both, but is not comfortable. Pt unable to come into office until weds. Pt will monitor s/s and seek care sooner if troubling s/s develop.  Reason for Disposition  All other males with painful urination  Answer Assessment - Initial Assessment Questions 1. SEVERITY: "How bad is the pain?"  (e.g., Scale 1-10; mild, moderate, or severe)   - MILD (1-3): Complains slightly about urination hurting.   - MODERATE (4-7): Interferes with normal activities.     - SEVERE (8-10): Excruciating, unwilling or unable to urinate because of the pain.      mild  4. ONSET: "When did the painful urination start?"      A couple of days 5. FEVER: "Do you have a fever?" If Yes, ask: "What is your temperature, how was it measured, and when did it start?"     no 7. CAUSE: "What do you think is causing the painful urination?"      Unsure 8. OTHER SYMPTOMS: "Do you have any other symptoms?" (e.g., flank pain, penis discharge, scrotal pain, blood in urine)     Difficulty having a BM.  Protocols used: Urination Pain - Male-A-AH

## 2023-05-10 NOTE — Progress Notes (Unsigned)
   There were no vitals taken for this visit.   Subjective:    Patient ID: Bryan Horton, male    DOB: 09/16/1986, 37 y.o.   MRN: [id number]  HPI: Bryan Horton is a 37 y.o. male  No chief complaint on file.   Relevant past medical, surgical, family and social history reviewed and updated as indicated. Interim medical history since our last visit reviewed. Allergies and medications reviewed and updated.  Review of Systems  Constitutional: Negative for fever or weight change.  Respiratory: Negative for cough and shortness of breath.   Cardiovascular: Negative for chest pain or palpitations.  Gastrointestinal: Negative for abdominal pain, no bowel changes.  Musculoskeletal: Negative for gait problem or joint swelling.  Skin: Negative for rash.  Neurological: Negative for dizziness or headache.  No other specific complaints in a complete review of systems (except as listed in HPI above).      Objective:    There were no vitals taken for this visit.  Wt Readings from Last 3 Encounters:  02/21/23 257 lb 9.6 oz (116.8 kg)  02/10/22 258 lb 4.8 oz (117.2 kg)  08/27/21 256 lb (116.1 kg)    Physical Exam  Constitutional: Patient appears well-developed and well-nourished. Obese *** No distress.  HEENT: head atraumatic, normocephalic, pupils equal and reactive to light, ears ***, neck supple, throat within normal limits Cardiovascular: Normal rate, regular rhythm and normal heart sounds.  No murmur heard. No BLE edema. Pulmonary/Chest: Effort normal and breath sounds normal. No respiratory distress. Abdominal: Soft.  There is no tenderness. Psychiatric: Patient has a normal mood and affect. behavior is normal. Judgment and thought content normal.   Results for orders placed or performed in visit on 02/21/23  Urine cytology ancillary only  Result Value Ref Range   Trichomonas Negative    Chlamydia Negative    Neisseria Gonorrhea Negative    Comment Normal Reference Ranger  Chlamydia - Negative    Comment Normal Reference Range Trichomonas - Negative    Comment      Normal Reference Range Neisseria Gonorrhea - Negative      Assessment & Plan:   Problem List Items Addressed This Visit   None    Follow up plan: No follow-ups on file.

## 2023-05-11 ENCOUNTER — Encounter: Payer: Self-pay | Admitting: Nurse Practitioner

## 2023-05-11 ENCOUNTER — Ambulatory Visit: Payer: 59 | Admitting: Nurse Practitioner

## 2023-05-11 ENCOUNTER — Other Ambulatory Visit: Payer: Self-pay

## 2023-05-11 ENCOUNTER — Ambulatory Visit
Admission: RE | Admit: 2023-05-11 | Discharge: 2023-05-11 | Disposition: A | Discharge status: Home / Self Care | Payer: 59 | Source: Ambulatory Visit | Attending: Nurse Practitioner | Admitting: Nurse Practitioner

## 2023-05-11 ENCOUNTER — Other Ambulatory Visit (HOSPITAL_COMMUNITY)
Admission: RE | Admit: 2023-05-11 | Discharge: 2023-05-11 | Disposition: A | Discharge status: Home / Self Care | Payer: 59 | Source: Ambulatory Visit | Attending: Nurse Practitioner | Admitting: Nurse Practitioner

## 2023-05-11 ENCOUNTER — Ambulatory Visit
Admission: RE | Admit: 2023-05-11 | Discharge: 2023-05-11 | Disposition: A | Discharge status: Home / Self Care | Payer: 59 | Attending: Nurse Practitioner | Admitting: Nurse Practitioner

## 2023-05-11 VITALS — BP 120/72 | HR 84 | Temp 98.2°F | Resp 18 | Ht 72.0 in | Wt 254.1 lb

## 2023-05-11 DIAGNOSIS — K5900 Constipation, unspecified: Secondary | ICD-10-CM

## 2023-05-11 DIAGNOSIS — R198 Other specified symptoms and signs involving the digestive system and abdomen: Secondary | ICD-10-CM

## 2023-05-11 DIAGNOSIS — R339 Retention of urine, unspecified: Secondary | ICD-10-CM

## 2023-05-11 LAB — POCT URINALYSIS DIPSTICK
Bilirubin, UA: NEGATIVE
Blood, UA: NEGATIVE
Glucose, UA: NEGATIVE
Ketones, UA: NEGATIVE
Leukocytes, UA: NEGATIVE
Nitrite, UA: NEGATIVE
Protein, UA: NEGATIVE
Spec Grav, UA: 1.02 (ref 1.010–1.025)
Urobilinogen, UA: 0.2 E.U./dL
pH, UA: 5 (ref 5.0–8.0)

## 2023-05-12 LAB — COMPLETE METABOLIC PANEL WITH GFR
AG Ratio: 1.4 (calc) (ref 1.0–2.5)
ALT: 19 U/L (ref 9–46)
AST: 16 U/L (ref 10–40)
Albumin: 4.7 g/dL (ref 3.6–5.1)
Alkaline phosphatase (APISO): 56 U/L (ref 36–130)
BUN: 15 mg/dL (ref 7–25)
CO2: 29 mmol/L (ref 20–32)
Calcium: 9.8 mg/dL (ref 8.6–10.3)
Chloride: 100 mmol/L (ref 98–110)
Creat: 0.96 mg/dL (ref 0.60–1.26)
Globulin: 3.3 g/dL (calc) (ref 1.9–3.7)
Glucose, Bld: 89 mg/dL (ref 65–99)
Potassium: 4.5 mmol/L (ref 3.5–5.3)
Sodium: 137 mmol/L (ref 135–146)
Total Bilirubin: 0.4 mg/dL (ref 0.2–1.2)
Total Protein: 8 g/dL (ref 6.1–8.1)
eGFR: 104 mL/min/{1.73_m2} (ref >=60)

## 2023-05-12 LAB — CBC WITH DIFFERENTIAL/PLATELET
Absolute Monocytes: 367 cells/uL (ref 200–950)
Basophils Absolute: 38 cells/uL (ref 0–200)
Basophils Relative: 0.8 %
Eosinophils Absolute: 197 cells/uL (ref 15–500)
Eosinophils Relative: 4.2 %
HCT: 48 % (ref 38.5–50.0)
Hemoglobin: 15.6 g/dL (ref 13.2–17.1)
Lymphs Abs: 2030 cells/uL (ref 850–3900)
MCH: 27.9 pg (ref 27.0–33.0)
MCHC: 32.5 g/dL (ref 32.0–36.0)
MCV: 85.9 fL (ref 80.0–100.0)
MPV: 9.9 fL (ref 7.5–12.5)
Monocytes Relative: 7.8 %
Neutro Abs: 2068 cells/uL (ref 1500–7800)
Neutrophils Relative %: 44 %
Platelets: 314 10*3/uL (ref 140–400)
RBC: 5.59 10*6/uL (ref 4.20–5.80)
RDW: 12.9 % (ref 11.0–15.0)
Total Lymphocyte: 43.2 %
WBC: 4.7 10*3/uL (ref 3.8–10.8)

## 2023-05-12 LAB — LIPID PANEL
Cholesterol: 178 mg/dL (ref <200)
HDL: 52 mg/dL (ref >=40)
LDL Cholesterol (Calc): 112 mg/dL (calc) — ABNORMAL HIGH
Non-HDL Cholesterol (Calc): 126 mg/dL (calc) (ref <130)
Total CHOL/HDL Ratio: 3.4 (calc) (ref <5.0)
Triglycerides: 57 mg/dL (ref <150)

## 2023-05-12 LAB — URINE CYTOLOGY ANCILLARY ONLY
Chlamydia: NEGATIVE
Comment: NEGATIVE
Comment: NEGATIVE
Comment: NORMAL
Neisseria Gonorrhea: NEGATIVE
Trichomonas: NEGATIVE

## 2023-05-12 LAB — PSA: PSA: 0.46 ng/mL (ref <=4.00)

## 2023-05-12 LAB — HEMOGLOBIN A1C
Hgb A1c MFr Bld: 5.8 % of total Hgb — ABNORMAL HIGH (ref <5.7)
Mean Plasma Glucose: 120 mg/dL
eAG (mmol/L): 6.6 mmol/L

## 2023-05-12 LAB — RPR: RPR Ser Ql: NONREACTIVE

## 2023-05-12 LAB — HIV ANTIBODY (ROUTINE TESTING W REFLEX): HIV 1&2 Ab, 4th Generation: NONREACTIVE

## 2023-05-13 ENCOUNTER — Other Ambulatory Visit: Payer: Self-pay | Admitting: Nurse Practitioner

## 2023-05-13 DIAGNOSIS — K5900 Constipation, unspecified: Secondary | ICD-10-CM

## 2023-05-13 MED ORDER — POLYETHYLENE GLYCOL 3350 17 GM/SCOOP PO POWD: 17.0000 g | Freq: Two times a day (BID) | ORAL | 1 refills | Status: DC | PRN

## 2023-06-16 ENCOUNTER — Telehealth: Payer: Self-pay | Admitting: Family Medicine

## 2023-06-16 NOTE — Telephone Encounter (Signed)
Left voicemail to inform patient that he has not had a type and screen completed and therefore we do not know his blood type. He was advised that he could obtain that information by way of blood donation and/or it would be completed at a time in which he may need a blood transfusion or hematological reason. Advised to call back with any further questions.

## 2023-06-16 NOTE — Telephone Encounter (Signed)
Copied from CRM [id number]. Topic: General - Inquiry >> Jun 16, 2023 11:49 AM Patsy Lager T wrote: Reason for CRM: patient is requesting a call back as he needs to know what his blood type is.

## 2023-07-11 ENCOUNTER — Other Ambulatory Visit: Payer: Self-pay | Admitting: Nurse Practitioner

## 2023-07-11 DIAGNOSIS — K5900 Constipation, unspecified: Secondary | ICD-10-CM

## 2023-07-11 NOTE — Telephone Encounter (Signed)
Copied from CRM [id number]. Topic: Referral - Request for Referral >> Jul 11, 2023 12:20 PM Marlow Baars wrote: Reason for CRM:  Needs referral Has patient seen PCP for this complaint? No. Not his provider but he did see Della Goo 2 months ago for this issue Referral for which specialty: Specialist dealing with bad constipation/ Gastrologist? Preferred provider/office: [facility] specialist who his provider recommends Reason for referral: Constipation, bloating, abdominal pain  Please assist patient further as he said a blockage was reported. From previous x rays. He said he used fiber but it hasn't really helped much.

## 2023-07-13 ENCOUNTER — Ambulatory Visit: Payer: Self-pay

## 2023-07-13 ENCOUNTER — Other Ambulatory Visit: Payer: Self-pay | Admitting: Family Medicine

## 2023-07-13 DIAGNOSIS — Z0000 Encounter for general adult medical examination without abnormal findings: Secondary | ICD-10-CM

## 2023-08-30 NOTE — Progress Notes (Unsigned)
Celso Amy, PA-C [address]  [address]  [place], [place] [address]  Main: [phone]  Fax: [phone]   Gastroenterology Consultation  Referring Provider:     Alba Cory, MD Primary Care Physician:  Alba Cory, MD Primary Gastroenterologist:  Celso Amy, PA-C / Dr. Midge Minium   Reason for Consultation:     Constipation, Change in Bowel Habits        HPI:   Bryan Horton is a 37 y.o. y/o male referred for consultation & management  by Alba Cory, MD.    He saw his PCP 04/2023 to evaluate constipation.  He has been having worsening constipation for 1 year.  He feels like there may be something obstructing the bowel movement.  It is difficult to evacuate.  Stools are typically soft.  He tried MiraLAX for 2 weeks which did not help and he discontinued.  No other treatment for constipation.  This has been a significant change in his bowel habits in the past year.  Previously he was very regular.  He denies rectal bleeding, abdominal pain, or weight loss.  He admits to gas and bloating.  Family history is unknown.  He was raised by his grandparents.  Labs 04/2023 showed normal CBC, CMP and PSA.  Hemoglobin 15.6.  Normal LFTs.  Last TSH in 2022 was normal.  Abdominal x-ray 04/2023 showed moderate stool throughout the colon and rectum consistent with constipation.  No obstruction.   No previous GI evaluation or colonoscopy.   Past Medical History:  Diagnosis Date   Obesity     Past Surgical History:  Procedure Laterality Date   CIRCUMCISION     as an infant   HERNIA REPAIR  05/13/2009   Umbilical    Prior to Admission medications   Medication Sig Start Date End Date Taking? Authorizing Provider  fluticasone (FLONASE) 50 MCG/ACT nasal spray Place 2 sprays into both nostrils daily. Patient not taking: Reported on 02/21/2023 08/27/21   Alba Cory, MD  loratadine (CLARITIN) 10 MG tablet Take 1 tablet (10 mg total) by mouth daily. Patient not  taking: Reported on 02/21/2023 08/27/21   Alba Cory, MD    Family History  Problem Relation Age of Onset   Hypertension Mother    Hypertension Father    Diabetes Father    Other Paternal Uncle      Social History   Tobacco Use   Smoking status: Former    Types: Cigars   Smokeless tobacco: Never   Tobacco comments:    he still smokes occasionally   Vaping Use   Vaping status: Never Used  Substance Use Topics   Alcohol use: Yes    Alcohol/week: 0.0 standard drinks of alcohol    Comment: seldom   Drug use: No    Allergies as of 08/31/2023   (No Known Allergies)    Review of Systems:    All systems reviewed and negative except where noted in HPI.   Physical Exam:  BP 124/82   Pulse 79   Temp 98.3 F (36.8 C)   Ht 6' (1.829 m)   Wt 259 lb 6.4 oz (117.7 kg)   BMI 35.18 kg/m  No LMP for male patient.  General:   Alert,  Well-developed, well-nourished, pleasant and cooperative in NAD Lungs:  Respirations even and unlabored.  Clear throughout to auscultation.   No wheezes, crackles, or rhonchi. No acute distress. Heart:  Regular rate and rhythm; no murmurs, clicks, rubs, or gallops. Abdomen:  Normal  bowel sounds.  No bruits.  Soft, and non-distended without masses, hepatosplenomegaly or hernias noted.  No Tenderness.  Mild discomfort due to feeling full.  No guarding or rebound tenderness.    Neurologic:  Alert and oriented x3;  grossly normal neurologically. Psych:  Alert and cooperative. Normal mood and affect.  Imaging Studies: No results found.  Assessment and Plan:   Kallon Caylor is a 37 y.o. y/o male has been referred for:  1.  Worsening constipation for 1 year  Discussed constipation treatment at length. Recommend High Fiber diet with fruits, vegetables, and whole grains. Drink 64 ounces of Fluids Daily. I recommed he Re-Start Miralax Mix 1 - 2 capfuls in a drink daily. I advised patient to stay on MiraLAX consistently every day. If no  improvement, consider Linzess, Amitiza or Trulance.   2.  Change in bowel habits -rule out colon neoplasm or pathology.  Scheduling Colonoscopy I discussed risks of colonoscopy with patient to include risk of bleeding, colon perforation, and risk of sedation.  Patient expressed understanding and agrees to proceed with colonoscopy.   I gave samples of Linzess 1 tablet once daily for 4 days prior to colonoscopy to help with bowel prep.  Gave GoLytely prep.  Follow up in 3 months with TG.  Celso Amy, PA-C

## 2023-08-31 ENCOUNTER — Ambulatory Visit: Payer: 59 | Admitting: Physician Assistant

## 2023-08-31 ENCOUNTER — Encounter: Payer: Self-pay | Admitting: Physician Assistant

## 2023-08-31 VITALS — BP 124/82 | HR 79 | Temp 98.3°F | Ht 72.0 in | Wt 259.4 lb

## 2023-08-31 DIAGNOSIS — R194 Change in bowel habit: Secondary | ICD-10-CM

## 2023-08-31 DIAGNOSIS — K5900 Constipation, unspecified: Secondary | ICD-10-CM

## 2023-08-31 MED ORDER — PEG 3350-KCL-NA BICARB-NACL 420 G PO SOLR: 4000.0000 mL | Freq: Once | ORAL | 0 refills | Status: AC

## 2023-08-31 NOTE — Patient Instructions (Signed)
Please drink 64 ounces of water and fluids every day.  Recommend high-fiber diet, 30 g of fiber daily with fruits, vegetables, whole grain breads and cereals.  Please take MiraLAX 1-2 capfuls every day consistently to help constipation.  If MiraLAX is not working well, please let me know and we will try prescription medication called Linzess.

## 2023-09-29 ENCOUNTER — Encounter: Payer: Self-pay | Admitting: Gastroenterology

## 2023-10-06 ENCOUNTER — Encounter
Admission: RE | Disposition: A | Discharge status: Home / Self Care | Payer: Self-pay | Source: Home / Self Care | Attending: Gastroenterology

## 2023-10-06 ENCOUNTER — Ambulatory Visit
Admission: RE | Admit: 2023-10-06 | Discharge: 2023-10-06 | Disposition: A | Discharge status: Home / Self Care | Payer: 59 | Attending: Gastroenterology | Admitting: Gastroenterology

## 2023-10-06 ENCOUNTER — Ambulatory Visit: Payer: 59 | Admitting: Certified Registered"

## 2023-10-06 ENCOUNTER — Encounter: Payer: Self-pay | Admitting: Gastroenterology

## 2023-10-06 DIAGNOSIS — R194 Change in bowel habit: Secondary | ICD-10-CM

## 2023-10-06 DIAGNOSIS — K5900 Constipation, unspecified: Secondary | ICD-10-CM

## 2023-10-06 DIAGNOSIS — K5730 Diverticulosis of large intestine without perforation or abscess without bleeding: Secondary | ICD-10-CM

## 2023-10-06 DIAGNOSIS — Z87891 Personal history of nicotine dependence: Secondary | ICD-10-CM | POA: Diagnosis not present

## 2023-10-06 HISTORY — PX: COLONOSCOPY WITH PROPOFOL: SHX5780

## 2023-10-06 SURGERY — COLONOSCOPY WITH PROPOFOL
Anesthesia: General

## 2023-10-06 MED ORDER — LIDOCAINE HCL (CARDIAC) PF 100 MG/5ML IV SOSY
PREFILLED_SYRINGE | INTRAVENOUS | Status: DC | PRN
  Administered 2023-10-06: 100 mg via INTRAVENOUS

## 2023-10-06 MED ORDER — PROPOFOL 500 MG/50ML IV EMUL
INTRAVENOUS | Status: DC | PRN
  Administered 2023-10-06: 150 ug/kg/min via INTRAVENOUS
  Administered 2023-10-06: 50 mg via INTRAVENOUS

## 2023-10-06 MED ORDER — DEXMEDETOMIDINE HCL IN NACL 80 MCG/20ML IV SOLN
INTRAVENOUS | Status: DC | PRN
  Administered 2023-10-06: 8 ug via INTRAVENOUS

## 2023-10-06 MED ORDER — SODIUM CHLORIDE 0.9 % IV SOLN: INTRAVENOUS | Status: DC

## 2023-10-06 NOTE — Anesthesia Preprocedure Evaluation (Signed)
Anesthesia Evaluation  Patient identified by MRN, date of birth, ID band Patient awake    Reviewed: Allergy & Precautions, NPO status , Patient's Chart, lab work & pertinent test results  History of Anesthesia Complications Negative for: history of anesthetic complications  Airway Mallampati: II  TM Distance: >3 FB Neck ROM: Full    Dental no notable dental hx. (+) Teeth Intact   Pulmonary neg pulmonary ROS, neg sleep apnea, neg COPD, Patient abstained from smoking.Not current smoker, former smoker   Pulmonary exam normal breath sounds clear to auscultation       Cardiovascular Exercise Tolerance: Good METS(-) hypertension(-) CAD and (-) Past MI negative cardio ROS (-) dysrhythmias  Rhythm:Regular Rate:Normal - Systolic murmurs    Neuro/Psych negative neurological ROS  negative psych ROS   GI/Hepatic ,neg GERD  ,,(+)     (-) substance abuse    Endo/Other  neg diabetes    Renal/GU negative Renal ROS     Musculoskeletal   Abdominal   Peds  Hematology   Anesthesia Other Findings Past Medical History: No date: Obesity  Reproductive/Obstetrics                             Anesthesia Physical Anesthesia Plan  ASA: 2  Anesthesia Plan: General   Post-op Pain Management: Minimal or no pain anticipated   Induction: Intravenous  PONV Risk Score and Plan: 2 and Propofol infusion, TIVA and Ondansetron  Airway Management Planned: Nasal Cannula  Additional Equipment: None  Intra-op Plan:   Post-operative Plan:   Informed Consent: I have reviewed the patients History and Physical, chart, labs and discussed the procedure including the risks, benefits and alternatives for the proposed anesthesia with the patient or authorized representative who has indicated his/her understanding and acceptance.     Dental advisory given  Plan Discussed with: CRNA and Surgeon  Anesthesia Plan  Comments: (Discussed risks of anesthesia with patient, including possibility of difficulty with spontaneous ventilation under anesthesia necessitating airway intervention, PONV, and rare risks such as cardiac or respiratory or neurological events, and allergic reactions. Discussed the role of CRNA in patient's perioperative care. Patient understands.)       Anesthesia Quick Evaluation

## 2023-10-06 NOTE — H&P (Signed)
Bryan Minium, MD [facility] [address] [place], [place] [address] [phone] Fax : [phone]  Primary Care Physician:  Alba Cory, MD Primary Gastroenterologist:  Dr. Servando Snare  Pre-Procedure History & Physical: HPI:  Bryan Horton is a 37 y.o. male is here for an colonoscopy.   Past Medical History:  Diagnosis Date   Obesity     Past Surgical History:  Procedure Laterality Date   CIRCUMCISION     as an infant   HERNIA REPAIR  05/13/2009   Umbilical    Prior to Admission medications   Medication Sig Start Date End Date Taking? Authorizing Provider  loratadine (CLARITIN) 10 MG tablet Take 1 tablet (10 mg total) by mouth daily. 08/27/21  Yes Sowles, Danna Hefty, MD  fluticasone (FLONASE) 50 MCG/ACT nasal spray Place 2 sprays into both nostrils daily. 08/27/21   Alba Cory, MD  polyethylene glycol powder (GLYCOLAX/MIRALAX) 17 GM/SCOOP powder Take 17 g by mouth 2 (two) times daily as needed. 05/13/23   Berniece Salines, FNP    Allergies as of 08/31/2023   (No Known Allergies)    Family History  Problem Relation Age of Onset   Hypertension Mother    Hypertension Father    Diabetes Father    Other Paternal Uncle     Social History   Socioeconomic History   Marital status: Significant Other    Spouse name: Carollee Herter    Number of children: 1   Years of education: Not on file   Highest education level: High school graduate  Occupational History   Occupation: utilities maintenance     Employer: [employer]  Tobacco Use   Smoking status: Former    Types: Cigars   Smokeless tobacco: Never   Tobacco comments:    he still smokes occasionally   Vaping Use   Vaping status: Never Used  Substance and Sexual Activity   Alcohol use: Yes    Alcohol/week: 0.0 standard drinks of alcohol    Comment: seldom   Drug use: No   Sexual activity: Yes    Partners: Female    Birth control/protection: None  Other Topics Concern   Not on file  Social History  Narrative   Lives with significant for the past 14 years, they have a 77 yo son   He works 12 hours shifts, it can be day or night.   Bought their house 10/07/2018    Social Determinants of Health   Financial Resource Strain: Low Risk  (02/21/2023)   Overall Financial Resource Strain (CARDIA)    Difficulty of Paying Living Expenses: Not hard at all  Food Insecurity: No Food Insecurity (02/21/2023)   Hunger Vital Sign    Worried About Running Out of Food in the Last Year: Never true    Ran Out of Food in the Last Year: Never true  Transportation Needs: No Transportation Needs (02/21/2023)   PRAPARE - Administrator, Civil Service (Medical): No    Lack of Transportation (Non-Medical): No  Physical Activity: Insufficiently Active (02/21/2023)   Exercise Vital Sign    Days of Exercise per Week: 3 days    Minutes of Exercise per Session: 30 min  Stress: Stress Concern Present (02/21/2023)   [employer] of Occupational Health - Occupational Stress Questionnaire    Feeling of Stress : To some extent  Social Connections: Moderately Integrated (02/21/2023)   Social Connection and Isolation Panel [NHANES]    Frequency of Communication with Friends and Family: More than three  times a week    Frequency of Social Gatherings with Friends and Family: Once a week    Attends Religious Services: 1 to 4 times per year    Active Member of [employer] or Organizations: Yes    Attends Engineer, structural: More than 4 times per year    Marital Status: Never married  Intimate Partner Violence: Not At Risk (02/21/2023)   Humiliation, Afraid, Rape, and Kick questionnaire    Fear of Current or Ex-Partner: No    Emotionally Abused: No    Physically Abused: No    Sexually Abused: No    Review of Systems: See HPI, otherwise negative ROS  Physical Exam: BP (!) 135/95   Pulse 71   Temp (!) 96.9 F (36.1 C) (Temporal)   Resp 18   Ht 6' (1.829 m)   Wt 117 kg   SpO2 100%   BMI 34.99  kg/m  General:   Alert,  pleasant and cooperative in NAD Head:  Normocephalic and atraumatic. Neck:  Supple; no masses or thyromegaly. Lungs:  Clear throughout to auscultation.    Heart:  Regular rate and rhythm. Abdomen:  Soft, nontender and nondistended. Normal bowel sounds, without guarding, and without rebound.   Neurologic:  Alert and  oriented x4;  grossly normal neurologically.  Impression/Plan: [name] is here for an colonoscopy to be performed for change in bowel habits  Risks, benefits, limitations, and alternatives regarding  colonoscopy have been reviewed with the patient.  Questions have been answered.  All parties agreeable.   Bryan Minium, MD  10/06/2023, 8:58 AM

## 2023-10-06 NOTE — Transfer of Care (Signed)
Immediate Anesthesia Transfer of Care Note  Patient: Bryan Horton  Procedure(s) Performed: COLONOSCOPY WITH PROPOFOL  Patient Location: PACU  Anesthesia Type:General  Level of Consciousness: awake, alert , and oriented  Airway & Oxygen Therapy: Patient Spontanous Breathing  Post-op Assessment: Report given to RN and Post -op Vital signs reviewed and stable  Post vital signs: stable  Last Vitals:  Vitals Value Taken Time  BP 124/99 10/06/23 0918  Temp    Pulse 82 10/06/23 0919  Resp 18 10/06/23 0919  SpO2 100 % 10/06/23 0919  Vitals shown include unfiled device data.  Last Pain:  Vitals:   10/06/23 0849  TempSrc: Temporal  PainSc: 0-No pain         Complications: No notable events documented.

## 2023-10-06 NOTE — Anesthesia Postprocedure Evaluation (Signed)
Anesthesia Post Note  Patient: [employer]  Procedure(s) Performed: COLONOSCOPY WITH PROPOFOL  Patient location during evaluation: Endoscopy Anesthesia Type: General Level of consciousness: awake and alert Pain management: pain level controlled Vital Signs Assessment: post-procedure vital signs reviewed and stable Respiratory status: spontaneous breathing, nonlabored ventilation, respiratory function stable and patient connected to nasal cannula oxygen Cardiovascular status: blood pressure returned to baseline and stable Postop Assessment: no apparent nausea or vomiting Anesthetic complications: no   There were no known notable events for this encounter.   Last Vitals:  Vitals:   10/06/23 0849 10/06/23 0918  BP: (!) 135/95 (!) 124/99  Pulse: 71 84  Resp: 18 20  Temp: (!) 36.1 C 36.4 C  SpO2: 100% 100%    Last Pain:  Vitals:   10/06/23 0938  TempSrc:   PainSc: 0-No pain                 Corinda Gubler

## 2023-10-06 NOTE — Op Note (Signed)
[facility] Gastroenterology Patient Name: Bryan Horton Procedure Date: 10/06/2023 8:58 AM MRN: [id number] Account #: [id number] Date of Birth: 1986-08-18 Admit Type: Outpatient Age: 37 Room: [facility] ENDO ROOM 4 Gender: Male Note Status: Finalized Instrument Name: Prentice Docker [id number] Procedure:             Colonoscopy Indications:           Constipation Providers:             Midge Minium MD, MD Referring MD:          Onnie Boer. Sowles, MD (Referring MD) Medicines:             Propofol per Anesthesia Complications:         No immediate complications. Procedure:             Pre-Anesthesia Assessment:                        - Prior to the procedure, a History and Physical was                         performed, and patient medications and allergies were                         reviewed. The patient's tolerance of previous                         anesthesia was also reviewed. The risks and benefits                         of the procedure and the sedation options and risks                         were discussed with the patient. All questions were                         answered, and informed consent was obtained. Prior                         Anticoagulants: The patient has taken no anticoagulant                         or antiplatelet agents. ASA Grade Assessment: II - A                         patient with mild systemic disease. After reviewing                         the risks and benefits, the patient was deemed in                         satisfactory condition to undergo the procedure.                        After obtaining informed consent, the colonoscope was                         passed under direct vision. Throughout the procedure,  the patient's blood pressure, pulse, and oxygen                         saturations were monitored continuously. The                         Colonoscope was introduced through the anus and                          advanced to the the cecum, identified by appendiceal                         orifice and ileocecal valve. The colonoscopy was                         performed without difficulty. The patient tolerated                         the procedure well. The quality of the bowel                         preparation was excellent. Findings:      The perianal and digital rectal examinations were normal.      A few small-mouthed diverticula were found in the transverse colon. Impression:            - Diverticulosis in the transverse colon.                        - No specimens collected. Recommendation:        - Discharge patient to home.                        - Resume previous diet.                        - Continue present medications.                        - Repeat colonoscopy at 38 years old for screening                         purposes. Procedure Code(s):     --- Professional ---                        [phone], Colonoscopy, flexible; diagnostic, including                         collection of specimen(s) by brushing or washing, when                         performed (separate procedure) Diagnosis Code(s):     --- Professional ---                        K59.00, Constipation, unspecified CPT copyright 2022 American Medical Association. All rights reserved. The codes documented in this report are preliminary and upon coder review may  be revised to meet current compliance requirements. Midge Minium MD, MD 10/06/2023 9:20:04 AM This report has been signed electronically. Number of Addenda: 0 Note Initiated On: 10/06/2023  8:58 AM Scope Withdrawal Time: 0 hours 6 minutes 43 seconds  Total Procedure Duration: 0 hours 9 minutes 29 seconds  Estimated Blood Loss:  Estimated blood loss: none.      [facility]

## 2023-10-07 ENCOUNTER — Encounter: Payer: Self-pay | Admitting: Gastroenterology

## 2024-06-26 ENCOUNTER — Ambulatory Visit (INDEPENDENT_AMBULATORY_CARE_PROVIDER_SITE_OTHER): Admitting: Family Medicine

## 2024-06-26 ENCOUNTER — Encounter: Payer: Self-pay | Admitting: Family Medicine

## 2024-06-26 VITALS — BP 122/84 | HR 63 | Resp 16 | Ht 72.0 in | Wt 243.0 lb

## 2024-06-26 DIAGNOSIS — R7303 Prediabetes: Secondary | ICD-10-CM

## 2024-06-26 DIAGNOSIS — Z113 Encounter for screening for infections with a predominantly sexual mode of transmission: Secondary | ICD-10-CM

## 2024-06-26 DIAGNOSIS — Z1159 Encounter for screening for other viral diseases: Secondary | ICD-10-CM | POA: Diagnosis not present

## 2024-06-26 DIAGNOSIS — Z0000 Encounter for general adult medical examination without abnormal findings: Secondary | ICD-10-CM

## 2024-06-26 NOTE — Progress Notes (Signed)
 Name: Bryan Horton   MRN: [id number]    DOB: 15-Mar-1986   Date:06/26/2024       Progress Note  Subjective  Chief Complaint  Chief Complaint  Patient presents with   Annual Exam    HPI  Patient presents for annual CPE .   Diet: discussed increasing in fiber in his diet and or add Fibercon tablets Exercise: continue regular physical activity  Last Dental Exam: up to date Last Eye Exam: up to date  Depression: phq 9 is negative    06/26/2024    9:07 AM 05/11/2023    9:37 AM 02/21/2023    8:33 AM 02/10/2022    8:25 AM 08/27/2021    8:04 AM  Depression screen PHQ 2/9  Decreased Interest 0 0 0 0 0  Down, Depressed, Hopeless 0 0 0 0 0  PHQ - 2 Score 0 0 0 0 0  Altered sleeping    0   Tired, decreased energy    0   Change in appetite    0   Feeling bad or failure about yourself     0   Trouble concentrating    0   Moving slowly or fidgety/restless    0   Suicidal thoughts    0   PHQ-9 Score    0   Difficult doing work/chores    Not difficult at all     Hypertension:  BP Readings from Last 3 Encounters:  06/26/24 122/84  10/06/23 (!) 124/99  08/31/23 124/82    Obesity: Wt Readings from Last 3 Encounters:  06/26/24 243 lb (110.2 kg)  10/06/23 258 lb (117 kg)  08/31/23 259 lb 6.4 oz (117.7 kg)   BMI Readings from Last 3 Encounters:  06/26/24 32.96 kg/m  10/06/23 34.99 kg/m  08/31/23 35.18 kg/m     [employer] Visit from 06/26/2024 in [facility]  AUDIT-C Score 0     Significant Other STD testing and prevention (HIV/chl/gon/syphilis):  yes Sexual history: one partner  Hep C Screening: completed Skin cancer: Discussed monitoring for atypical lesions Colorectal cancer: up to date, had it done last year due to constipation  Prostate cancer:  not applicable Lab Results  Component Value Date   PSA 0.46 05/11/2023     Lung cancer:  Low Dose CT Chest recommended if Age 46-80 years, 30 pack-year currently smoking OR have  quit w/in 15years. Patient  is not a candidate for screening   AAA: The USPSTF recommends one-time screening with ultrasonography in men ages 29 to 75 years who have ever smoked. Patient   is not a candidate for screening  ECG:  N/A  Vaccines: reviewed with the patient.   Advanced Care Planning: A voluntary discussion about advance care planning including the explanation and discussion of advance directives.  Discussed health care proxy and Living will, and the patient was able to identify a health care proxy as parents.  Patient does not have a living will and power of attorney of health care   Patient Active Problem List   Diagnosis Date Noted   Constipation 10/06/2023   Change in bowel habits 10/06/2023   Shift work sleep disorder 01/01/2016   Tinea versicolor 01/01/2016   Pain in shoulder 01/01/2016   Obesity (BMI 30.0-34.9) 01/01/2016    Past Surgical History:  Procedure Laterality Date   CIRCUMCISION     as an infant   COLONOSCOPY WITH PROPOFOL  N/A 10/06/2023   Procedure: COLONOSCOPY WITH PROPOFOL ;  Surgeon:  Jinny Carmine, MD;  Location: [facility] ENDOSCOPY;  Service: Endoscopy;  Laterality: N/A;   HERNIA REPAIR  05/13/2009   Umbilical    Family History  Problem Relation Age of Onset   Hypertension Mother    Hypertension Father    Diabetes Father    Other Paternal Uncle     Social History   Socioeconomic History   Marital status: Significant Other    Spouse name: Clotilda    Number of children: 1   Years of education: Not on file   Highest education level: High school graduate  Occupational History   Occupation: utilities maintenance     Employer: [employer]  Tobacco Use   Smoking status: Former    Types: Cigars   Smokeless tobacco: Never   Tobacco comments:    he still smokes occasionally   Vaping Use   Vaping status: Never Used  Substance and Sexual Activity   Alcohol use: Yes    Alcohol/week: 0.0 standard drinks of alcohol    Comment: seldom   Drug  use: No   Sexual activity: Yes    Partners: Female    Birth control/protection: None  Other Topics Concern   Not on file  Social History Narrative   Lives with significant for the past 14 years, they have a 99 yo son   He works 12 hours shifts, it can be day or night.   Bought their house 10/07/2018    Social Drivers of Health   Financial Resource Strain: Low Risk  (06/26/2024)   Overall Financial Resource Strain (CARDIA)    Difficulty of Paying Living Expenses: Not hard at all  Food Insecurity: No Food Insecurity (06/26/2024)   Hunger Vital Sign    Worried About Running Out of Food in the Last Year: Never true    Ran Out of Food in the Last Year: Never true  Transportation Needs: No Transportation Needs (06/26/2024)   PRAPARE - Administrator, Civil Service (Medical): No    Lack of Transportation (Non-Medical): No  Physical Activity: Sufficiently Active (06/26/2024)   Exercise Vital Sign    Days of Exercise per Week: 7 days    Minutes of Exercise per Session: 60 min  Stress: No Stress Concern Present (06/26/2024)   [employer] of Occupational Health - Occupational Stress Questionnaire    Feeling of Stress: Only a little  Social Connections: Moderately Integrated (06/26/2024)   Social Connection and Isolation Panel    Frequency of Communication with Friends and Family: More than three times a week    Frequency of Social Gatherings with Friends and Family: Once a week    Attends Religious Services: More than 4 times per year    Active Member of [employer] or Organizations: Yes    Attends Banker Meetings: More than 4 times per year    Marital Status: Never married  Intimate Partner Violence: Not At Risk (06/26/2024)   Humiliation, Afraid, Rape, and Kick questionnaire    Fear of Current or Ex-Partner: No    Emotionally Abused: No    Physically Abused: No    Sexually Abused: No    No current outpatient medications on file.  No Known  Allergies   ROS  Constitutional: Negative for fever or weight change.  Respiratory: Negative for cough and shortness of breath.   Cardiovascular: Negative for chest pain or palpitations.  Gastrointestinal: Negative for abdominal pain, no bowel changes.  Musculoskeletal: Negative for gait problem or joint swelling.  Skin:  Negative for rash.  Neurological: Negative for dizziness or headache.  No other specific complaints in a complete review of systems (except as listed in HPI above).   Objective  Vitals:   06/26/24 0914  BP: 122/84  Pulse: 63  Resp: 16  SpO2: 98%  Weight: 243 lb (110.2 kg)  Height: 6' (1.829 m)    Body mass index is 32.96 kg/m.  Physical Exam  Constitutional: Patient appears well-developed and well-nourished. No distress.  HENT: Head: Normocephalic and atraumatic. Ears: B TMs ok, no erythema or effusion; Nose: Nose normal. Mouth/Throat: Oropharynx is clear and moist. No oropharyngeal exudate.  Eyes: Conjunctivae and EOM are normal. Pupils are equal, round, and reactive to light. No scleral icterus.  Neck: Normal range of motion. Neck supple. No JVD present. No thyromegaly present.  Cardiovascular: Normal rate, regular rhythm and normal heart sounds.  No murmur heard. No BLE edema. Pulmonary/Chest: Effort normal and breath sounds normal. No respiratory distress. Abdominal: Soft. Bowel sounds are normal, no distension. There is no tenderness. no masses MALE GENITALIA: Normal descended testes bilaterally, no masses palpated, no hernias, no lesions, no discharge RECTAL: not done  Musculoskeletal: Normal range of motion, no joint effusions. No gross deformities Neurological: he is alert and oriented to person, place, and time. No cranial nerve deficit. Coordination, balance, strength, speech and gait are normal.  Skin: Skin is warm and dry. No rash noted. No erythema.  Psychiatric: Patient has a normal mood and affect. behavior is normal. Judgment and thought  content normal.     Assessment & Plan  1. Well adult exam (Primary)  - Lipid Profile - CBC with Differential/Platelet - HgB A1c - Comprehensive Metabolic Panel (CMET) - Hepatitis B Surface AntiBODY - RPR - HIV Antibody (routine testing w rflx)  2. Prediabetes  - HgB A1c - Comprehensive Metabolic Panel (CMET)  3. Need for hepatitis B screening test  - Hepatitis B Surface AntiBODY  4. Screening examination for STI  - RPR - HIV Antibody (routine testing w rflx)    -Prostate cancer screening and PSA options (with potential risks and benefits of testing vs not testing) were discussed along with recent recs/guidelines. -USPSTF grade A and B recommendations reviewed with patient; age-appropriate recommendations, preventive care, screening tests, etc discussed and encouraged; healthy living encouraged; see AVS for patient education given to patient -Discussed importance of 150 minutes of physical activity weekly, eat two servings of fish weekly, eat one serving of tree nuts ( cashews, pistachios, pecans, almonds.SABRA) every other day, eat 6 servings of fruit/vegetables daily and drink plenty of water and avoid sweet beverages.  -Reviewed Health Maintenance: yes

## 2024-06-27 ENCOUNTER — Ambulatory Visit: Payer: Self-pay | Admitting: Family Medicine

## 2024-06-28 ENCOUNTER — Other Ambulatory Visit: Payer: Self-pay

## 2024-06-28 DIAGNOSIS — R7989 Other specified abnormal findings of blood chemistry: Secondary | ICD-10-CM

## 2024-06-28 LAB — COMPREHENSIVE METABOLIC PANEL WITH GFR
AG Ratio: 1.7 (calc) (ref 1.0–2.5)
ALT: 13 U/L (ref 9–46)
AST: 15 U/L (ref 10–40)
Albumin: 4.5 g/dL (ref 3.6–5.1)
Alkaline phosphatase (APISO): 55 U/L (ref 36–130)
BUN: 11 mg/dL (ref 7–25)
CO2: 30 mmol/L (ref 20–32)
Calcium: 9.4 mg/dL (ref 8.6–10.3)
Chloride: 101 mmol/L (ref 98–110)
Creat: 0.98 mg/dL (ref 0.60–1.26)
Globulin: 2.7 g/dL (ref 1.9–3.7)
Glucose, Bld: 92 mg/dL (ref 65–99)
Potassium: 4.1 mmol/L (ref 3.5–5.3)
Sodium: 139 mmol/L (ref 135–146)
Total Bilirubin: 0.6 mg/dL (ref 0.2–1.2)
Total Protein: 7.2 g/dL (ref 6.1–8.1)
eGFR: 101 mL/min/1.73m2 (ref >=60)

## 2024-06-28 LAB — LIPID PANEL
Cholesterol: 169 mg/dL (ref <200)
HDL: 48 mg/dL (ref >=40)
LDL Cholesterol (Calc): 107 mg/dL — ABNORMAL HIGH
Non-HDL Cholesterol (Calc): 121 mg/dL (ref <130)
Total CHOL/HDL Ratio: 3.5 (calc) (ref <5.0)
Triglycerides: 62 mg/dL (ref <150)

## 2024-06-28 LAB — HEMOGLOBIN A1C
Hgb A1c MFr Bld: 5.7 % — ABNORMAL HIGH (ref <5.7)
Mean Plasma Glucose: 117 mg/dL
eAG (mmol/L): 6.5 mmol/L

## 2024-06-28 LAB — HIV ANTIBODY (ROUTINE TESTING W REFLEX): HIV 1&2 Ab, 4th Generation: NONREACTIVE

## 2024-06-28 LAB — CBC WITH DIFFERENTIAL/PLATELET
Absolute Lymphocytes: 2080 {cells}/uL (ref 850–3900)
Absolute Monocytes: 415 {cells}/uL (ref 200–950)
Basophils Absolute: 50 {cells}/uL (ref 0–200)
Basophils Relative: 1 %
Eosinophils Absolute: 140 {cells}/uL (ref 15–500)
Eosinophils Relative: 2.8 %
HCT: 45.6 % (ref 38.5–50.0)
Hemoglobin: 14.7 g/dL (ref 13.2–17.1)
MCH: 28.5 pg (ref 27.0–33.0)
MCHC: 32.2 g/dL (ref 32.0–36.0)
MCV: 88.5 fL (ref 80.0–100.0)
MPV: 10.2 fL (ref 7.5–12.5)
Monocytes Relative: 8.3 %
Neutro Abs: 2315 {cells}/uL (ref 1500–7800)
Neutrophils Relative %: 46.3 %
Platelets: 280 Thousand/uL (ref 140–400)
RBC: 5.15 Million/uL (ref 4.20–5.80)
RDW: 13.2 % (ref 11.0–15.0)
Total Lymphocyte: 41.6 %
WBC: 5 Thousand/uL (ref 3.8–10.8)

## 2024-06-28 LAB — TSH: TSH: 0.34 m[IU]/L — ABNORMAL LOW (ref 0.40–4.50)

## 2024-06-28 LAB — HEPATITIS B SURFACE ANTIBODY,QUALITATIVE: Hep B S Ab: REACTIVE — AB

## 2024-06-28 LAB — RPR: RPR Ser Ql: NONREACTIVE

## 2024-06-28 LAB — T4, FREE: Free T4: 1.1 ng/dL (ref 0.8–1.8)

## 2024-06-28 LAB — TEST AUTHORIZATION

## 2024-07-23 ENCOUNTER — Encounter: Payer: Self-pay | Admitting: Internal Medicine

## 2024-07-23 ENCOUNTER — Ambulatory Visit: Admitting: Internal Medicine

## 2024-07-23 ENCOUNTER — Other Ambulatory Visit: Payer: Self-pay

## 2024-07-23 VITALS — BP 148/100 | HR 100 | Temp 100.6°F | Resp 18 | Ht 72.0 in | Wt 248.6 lb

## 2024-07-23 DIAGNOSIS — U071 COVID-19: Secondary | ICD-10-CM

## 2024-07-23 DIAGNOSIS — R030 Elevated blood-pressure reading, without diagnosis of hypertension: Secondary | ICD-10-CM

## 2024-07-23 DIAGNOSIS — R5081 Fever presenting with conditions classified elsewhere: Secondary | ICD-10-CM

## 2024-07-23 MED ORDER — FLUTICASONE PROPIONATE 50 MCG/ACT NA SUSP: 2.0000 | Freq: Every day | NASAL | 6 refills | Status: AC

## 2024-07-23 MED ORDER — ACETAMINOPHEN 500 MG PO TABS: 500.0000 mg | ORAL_TABLET | Freq: Once | ORAL | Status: AC

## 2024-07-23 MED ORDER — NIRMATRELVIR/RITONAVIR (PAXLOVID)TABLET: 3.0000 | ORAL_TABLET | Freq: Two times a day (BID) | ORAL | 0 refills | Status: AC

## 2024-07-23 NOTE — Progress Notes (Signed)
 Acute Office Visit  Subjective:     Patient ID: Bryan Horton, male    DOB: January 07, 1986, 38 y.o.   MRN: [id number]  Chief Complaint  Patient presents with   Hypertension   Covid Positive    Hypertension Associated symptoms include headaches. Pertinent negatives include no chest pain or shortness of breath.   Patient is in today for COVID symptoms after testing positive 07/18/24. He is a patient of Dr. Glenard and this is my first time meeting him.  Discussed the use of AI scribe software for clinical note transcription with the patient, who gave verbal consent to proceed.  History of Present Illness Bryan Horton is a 38 year old male who presents with symptoms of COVID-19 after returning from [place].  He began experiencing symptoms while returning from [place] and tested positive for COVID-19 on July 17, 2024. Symptoms include congestion, headaches, sinus pressure, and earache on one side, extending to the top of his teeth. He has a fever of 100.40F today. No cough or lung-related symptoms are present.  He takes over-the-counter medications, including a 1200 mg dose of a medication possibly Sudafed PE, and alternates ibuprofen and Tylenol  for symptom relief. He stopped taking Sudafed due to concerns about elevated blood pressure, which is 148/100 today.  He has significant sinus congestion and pain, with discharge from one side. He lives thirty minutes away from the clinic and has a sister who is a paramedic to assist in monitoring his blood pressure at home.    Review of Systems  Constitutional:  Positive for chills and fever.  HENT:  Positive for congestion, ear pain and sinus pain.   Respiratory:  Negative for cough, sputum production, shortness of breath and wheezing.   Cardiovascular:  Negative for chest pain.  Neurological:  Positive for headaches.        Objective:    BP (!) 148/100 (Cuff Size: Large)   Pulse 100   Temp (!) 100.6 F (38.1 C) (Oral)   Resp  18   Ht 6' (1.829 m)   Wt 248 lb 9.6 oz (112.8 kg)   SpO2 97%   BMI 33.72 kg/m  BP Readings from Last 3 Encounters:  07/23/24 (!) 148/100  06/26/24 122/84  10/06/23 (!) 124/99   Wt Readings from Last 3 Encounters:  07/23/24 248 lb 9.6 oz (112.8 kg)  06/26/24 243 lb (110.2 kg)  10/06/23 258 lb (117 kg)      Physical Exam Constitutional:      Appearance: Normal appearance.  HENT:     Head: Normocephalic and atraumatic.     Right Ear: Tympanic membrane, ear canal and external ear normal.     Left Ear: Tympanic membrane, ear canal and external ear normal.     Nose: Congestion present.     Mouth/Throat:     Mouth: Mucous membranes are moist.     Pharynx: Oropharynx is clear.  Eyes:     Conjunctiva/sclera: Conjunctivae normal.  Cardiovascular:     Rate and Rhythm: Normal rate and regular rhythm.  Pulmonary:     Effort: Pulmonary effort is normal.     Breath sounds: Normal breath sounds. No wheezing, rhonchi or rales.  Skin:    General: Skin is warm and dry.  Neurological:     General: No focal deficit present.     Mental Status: He is alert. Mental status is at baseline.  Psychiatric:        Mood and Affect: Mood normal.  Behavior: Behavior normal.     No results found for any visits on 07/23/24.      Assessment & Plan:   Assessment & Plan COVID-19 infection with acute sinusitis symptoms and fever COVID-19 infection with acute sinusitis symptoms, primarily upper respiratory, without cough or lung involvement. Antiviral treatment considered due to symptom onset within potential treatment window. - Prescribe Paxlovid  for antiviral treatment. - Recommend continued use of Tylenol  and ibuprofen for fever and body aches. - Advise nasal irrigation with saline using distilled or boiled water. - Prescribe nasal steroid spray to reduce inflammation and congestion. - Recommend Corcidin as an alternative to Sudafed for sinus congestion. - Advise against oral steroids  due to potential immune suppression. - Provide work note for one week off, with return to work contingent on symptom improvement.  Elevated blood pressure, likely transient Blood pressure at 148/100, likely elevated due to Sudafed use and acute illness. No chronic hypertension diagnosis. - Advise discontinuation of Sudafed to avoid further elevation of blood pressure. - Plan to recheck blood pressure in one week to assess for resolution of elevated readings. - Advise monitoring blood pressure at home if possible, with assistance from sister who is a paramedic.  - nirmatrelvir /ritonavir  (PAXLOVID ) 20 x 150 MG & 10 x 100MG  TABS; Take 3 tablets by mouth 2 (two) times daily for 5 days. (Take nirmatrelvir  150 mg two tablets twice daily for 5 days and ritonavir  100 mg one tablet twice daily for 5 days) Patient GFR is 110  Dispense: 30 tablet; Refill: 0 - fluticasone  (FLONASE ) 50 MCG/ACT nasal spray; Place 2 sprays into both nostrils daily.  Dispense: 16 g; Refill: 6 - acetaminophen  (TYLENOL ) tablet 500 mg   Return in about 1 week (around 07/30/2024) for BP check .  Sharyle Fischer, DO

## 2024-07-27 ENCOUNTER — Other Ambulatory Visit: Payer: Self-pay | Admitting: Internal Medicine

## 2024-07-27 ENCOUNTER — Encounter: Payer: Self-pay | Admitting: Internal Medicine

## 2024-07-27 DIAGNOSIS — I10 Essential (primary) hypertension: Secondary | ICD-10-CM

## 2024-07-27 MED ORDER — HYDROCHLOROTHIAZIDE 12.5 MG PO CAPS: 12.5000 mg | ORAL_CAPSULE | Freq: Every day | ORAL | 1 refills | Status: AC

## 2024-08-18 ENCOUNTER — Other Ambulatory Visit: Payer: Self-pay | Admitting: Internal Medicine

## 2024-08-18 DIAGNOSIS — I10 Essential (primary) hypertension: Secondary | ICD-10-CM

## 2024-08-20 NOTE — Telephone Encounter (Signed)
 Requested medication (s) are due for refill today:   Yes  Requested medication (s) are on the active medication list:   Yes  Future visit scheduled:   Yes Aug. 2026   Last ordered: 07/27/2024 30, 1 refill  Returned because a DX Code and 90 day supply being requested    Requested Prescriptions  Pending Prescriptions Disp Refills   hydrochlorothiazide  (MICROZIDE ) 12.5 MG capsule [Pharmacy Med Name: HYDROCHLOROTHIAZIDE  12.5 MG CP] 90 capsule 1    Sig: TAKE 1 CAPSULE BY MOUTH EVERY DAY     Cardiovascular: Diuretics - Thiazide Failed - 08/20/2024  2:42 PM      Failed - Last BP in normal range    BP Readings from Last 1 Encounters:  07/23/24 (!) 148/100         Passed - Cr in normal range and within 180 days    Creat  Date Value Ref Range Status  06/26/2024 0.98 0.60 - 1.26 mg/dL Final         Passed - K in normal range and within 180 days    Potassium  Date Value Ref Range Status  06/26/2024 4.1 3.5 - 5.3 mmol/L Final         Passed - Na in normal range and within 180 days    Sodium  Date Value Ref Range Status  06/26/2024 139 135 - 146 mmol/L Final  01/01/2016 139 134 - 144 mmol/L Final         Passed - Valid encounter within last 6 months    Recent Outpatient Visits           4 weeks ago COVID-19   [facility] Bernardo Fend, DO   1 month ago Well adult exam   [facility] Glenard Mire, MD       Future Appointments             In 10 months Sowles, Krichna, MD [facility], [place]

## 2025-07-11 ENCOUNTER — Encounter: Admitting: Family Medicine
